# Patient Record
Sex: Female | Born: 1960 | Race: White | Hispanic: No | Marital: Single | Smoking: Former smoker
Health system: Southern US, Community
[De-identification: ages and names within clinical notes are randomized; demographics above are authoritative.]

## PROBLEM LIST (undated history)

## (undated) DIAGNOSIS — G709 Myoneural disorder, unspecified: Secondary | ICD-10-CM

## (undated) DIAGNOSIS — E785 Hyperlipidemia, unspecified: Secondary | ICD-10-CM

## (undated) DIAGNOSIS — R7303 Prediabetes: Secondary | ICD-10-CM

## (undated) DIAGNOSIS — M1990 Unspecified osteoarthritis, unspecified site: Secondary | ICD-10-CM

## (undated) DIAGNOSIS — J449 Chronic obstructive pulmonary disease, unspecified: Secondary | ICD-10-CM

## (undated) HISTORY — PX: DILATION AND CURETTAGE OF UTERUS: SHX78

## (undated) HISTORY — DX: Hyperlipidemia, unspecified: E78.5

## (undated) HISTORY — PX: WISDOM TOOTH EXTRACTION: SHX21

## (undated) HISTORY — PX: TONSILLECTOMY: SUR1361

---

## 2014-11-21 ENCOUNTER — Institutional Professional Consult (permissible substitution): Payer: 59 | Admitting: Internal Medicine

## 2014-11-26 ENCOUNTER — Ambulatory Visit (INDEPENDENT_AMBULATORY_CARE_PROVIDER_SITE_OTHER): Payer: 59 | Admitting: Internal Medicine

## 2014-11-26 ENCOUNTER — Encounter (INDEPENDENT_AMBULATORY_CARE_PROVIDER_SITE_OTHER): Payer: Self-pay

## 2014-11-26 ENCOUNTER — Encounter: Payer: Self-pay | Admitting: Internal Medicine

## 2014-11-26 VITALS — BP 122/80 | HR 93 | Ht 61.0 in | Wt 137.0 lb

## 2014-11-26 DIAGNOSIS — F17210 Nicotine dependence, cigarettes, uncomplicated: Secondary | ICD-10-CM

## 2014-11-26 DIAGNOSIS — Z720 Tobacco use: Secondary | ICD-10-CM

## 2014-11-26 DIAGNOSIS — Z87891 Personal history of nicotine dependence: Secondary | ICD-10-CM | POA: Insufficient documentation

## 2014-11-26 DIAGNOSIS — J449 Chronic obstructive pulmonary disease, unspecified: Secondary | ICD-10-CM

## 2014-11-26 MED ORDER — BUDESONIDE-FORMOTEROL FUMARATE 80-4.5 MCG/ACT IN AERO: INHALATION_SPRAY | RESPIRATORY_TRACT | Status: DC

## 2014-11-26 NOTE — Progress Notes (Signed)
Subjective:     Patient ID: Ann Richardson, female   DOB: May 07, 1961,   MRN: [id number]  HPI  [age] yowf active smoker never intense aerobic but able to do sports/ soft ball and did fine until around 2010 with doe with yardwork with assoc cough in addition has in frequency frequency episodes of bronchitis rx abx/ prednisone better then tried advair/ singulair no better so referred pulmonary clinic 11/26/2014 by  Ann RenderElizabeth Cobb NP    11/26/2014 1st [facility] office visit/ Ann Richardson   Chief Complaint  Patient presents with  . Pulmonary Consult    Referred by Ann RenderElizabeth Cobb, NP. Pt states she has been dxed with Bronchitis x 3 so far in 2016. She c/o cough and SOB. She gets SOB with walking up stairs. Her cough is non prod at this time. She is using albuterol inhaler 2-3 x per wk on average.   no real change symptoms on advair but hfa poor  Cough quite a bit worse in am but not productive p last round of prednisone and her goal is to prevent future flares   No obvious day to day or daytime variabilty or assoc   cp or chest tightness, subjective wheeze overt sinus or hb symptoms. No unusual exp hx or h/o childhood pna/ asthma or knowledge of premature birth.  Sleeping ok without nocturnal  or early am exacerbation  of respiratory  c/o's or need for noct saba. Also denies any obvious fluctuation of symptoms with weather or environmental changes or other aggravating or alleviating factors except as outlined above   Current Medications, Allergies, Complete Past Medical History, Past Surgical History, Family History, and Social History were reviewed in [facility] electronic medical record.  ROS  The following are not active complaints unless bolded sore throat, dysphagia, dental problems, itching, sneezing,  nasal congestion or excess/ purulent secretions, ear ache,   fever, chills, sweats, unintended wt loss, pleuritic or exertional cp, hemoptysis,  orthopnea pnd or leg swelling, presyncope,  palpitations, heartburn, abdominal pain, anorexia, nausea, vomiting, diarrhea  or change in bowel or urinary habits, change in stools or urine, dysuria,hematuria,  rash, arthralgias, visual complaints, headache, numbness weakness or ataxia or problems with walking or coordination,  change in mood/affect or memory.          Review of Systems     Objective:   Physical Exam amb wf nad with strong [place] accent   Wt Readings from Last 3 Encounters:  11/26/14 137 lb (62.143 kg)    Vital signs reviewed   HEENT: nl dentition, turbinates, and orophanx. Nl external ear canals without cough reflex   NECK :  without JVD/Nodes/TM/ nl carotid upstrokes bilaterally   LUNGS: no acc muscle use, minimal insp and exp rhonchi bilaterally s cough provoked   CV:  RRR  no s3 or murmur or increase in P2, no edema   ABD:  soft and nontender with nl excursion in the supine position. No bruits or organomegaly, bowel sounds nl  MS:  warm without deformities, calf tenderness, cyanosis or clubbing  SKIN: warm and dry without lesions    NEURO:  alert, approp, no deficits    I personally reviewed images and agree with radiology impression as follows:  CXR:  Nov 05 2014   wnl       Assessment:

## 2014-11-26 NOTE — Assessment & Plan Note (Signed)

## 2014-11-26 NOTE — Assessment & Plan Note (Signed)
She is probably only mild copd/CB but with chronic dry cough and doe needs pfts to sort out  In meantime symptoms have been difficult to control on rx with advair and singuair and apparently only respond to prednisone   DDX of  difficult airways management all start with A and  include Adherence, Ace Inhibitors, Acid Reflux, Active Sinus Disease, Alpha 1 Antitripsin deficiency, Anxiety masquerading as Airways dz,  ABPA,  allergy(esp in young), Aspiration (esp in elderly), Adverse effects of DPI,  Active smokers, plus two Bs  = Bronchiectasis and Beta blocker use..and one C= CHF  Adherence is always the initial "prime suspect" and is a multilayered concern that requires a "trust but verify" approach in every patient - starting with knowing how to use medications, especially inhalers, correctly, keeping up with refills and understanding the fundamental difference between maintenance and prns vs those medications only taken for a very short course and then stopped and not refilled.  The proper method of use, as well as anticipated side effects, of a metered-dose inhaler are discussed and demonstrated to the patient. Improved effectiveness after extensive coaching during this visit to a level of approximately  75% from a baseline of < 25%  Active smoking clearly the greatest issue - discussed sep  ? Allergy > not a typical pattern at all, ok to d/c singulair  ? Adverse effect of meds > need to be careful here because ICS in high doses and most advair products cause pts to cough in a dose dep fashion due to effects on upper airway   Each maintenance medication was reviewed in detail including most importantly the difference between maintenance and as needed and under what circumstances the prns are to be used.  Please see instructions for details which were reviewed in writing and the patient given a copy.

## 2014-11-26 NOTE — Patient Instructions (Addendum)
Stop advair and singulair   Plan A = automatic = symbicort 80 Take 2 puffs first thing in am and then another 2 puffs about 12 hours later.   Plan B = backup - Only use your albuterol as a rescue medication to be used if you can't catch your breath by resting or doing a relaxed purse lip breathing pattern.  - The less you use it, the better it will work when you need it. - Ok to use up to 2 puffs  every 4 hours if you must but call for immediate appointment if use goes up over your usual need - Don't leave home without it !!  (think of it like the spare tire for your car)   The key is to stop smoking completely before smoking completely stops you!   Please schedule a follow up office visit in 6 weeks, call sooner if needed with pfts on return

## 2015-01-07 ENCOUNTER — Other Ambulatory Visit: Payer: Self-pay | Admitting: Internal Medicine

## 2015-01-07 DIAGNOSIS — R0600 Dyspnea, unspecified: Secondary | ICD-10-CM

## 2015-01-08 ENCOUNTER — Ambulatory Visit (INDEPENDENT_AMBULATORY_CARE_PROVIDER_SITE_OTHER): Payer: 59 | Admitting: Internal Medicine

## 2015-01-08 ENCOUNTER — Encounter: Payer: Self-pay | Admitting: Internal Medicine

## 2015-01-08 ENCOUNTER — Other Ambulatory Visit: Payer: 59

## 2015-01-08 VITALS — BP 118/80 | HR 79 | Ht 61.0 in | Wt 140.0 lb

## 2015-01-08 DIAGNOSIS — R0600 Dyspnea, unspecified: Secondary | ICD-10-CM

## 2015-01-08 DIAGNOSIS — F17210 Nicotine dependence, cigarettes, uncomplicated: Secondary | ICD-10-CM

## 2015-01-08 DIAGNOSIS — J449 Chronic obstructive pulmonary disease, unspecified: Secondary | ICD-10-CM

## 2015-01-08 DIAGNOSIS — Z720 Tobacco use: Secondary | ICD-10-CM | POA: Diagnosis not present

## 2015-01-08 LAB — PULMONARY FUNCTION TEST
DL/VA % pred: 73 %
DL/VA: 3.23 ml/min/mmHg/L
DLCO unc % pred: 57 %
DLCO unc: 11.61 ml/min/mmHg
FEF 25-75 Post: 0.72 L/sec
FEF 25-75 Pre: 0.5 L/sec
FEF2575-%Change-Post: 42 %
FEF2575-%PRED-PRE: 21 %
FEF2575-%Pred-Post: 30 %
FEV1-%Change-Post: 9 %
FEV1-%PRED-POST: 54 %
FEV1-%Pred-Pre: 50 %
FEV1-Post: 1.31 L
FEV1-Pre: 1.2 L
FEV1FVC-%Change-Post: -3 %
FEV1FVC-%PRED-PRE: 73 %
FEV6-%Change-Post: 11 %
FEV6-%PRED-POST: 75 %
FEV6-%PRED-PRE: 67 %
FEV6-POST: 2.25 L
FEV6-Pre: 2.01 L
FEV6FVC-%Change-Post: 0 %
FEV6FVC-%Pred-Post: 99 %
FEV6FVC-%Pred-Pre: 100 %
FVC-%CHANGE-POST: 12 %
FVC-%Pred-Post: 75 %
FVC-%Pred-Pre: 67 %
FVC-PRE: 2.06 L
FVC-Post: 2.33 L
POST FEV1/FVC RATIO: 56 %
POST FEV6/FVC RATIO: 97 %
Pre FEV1/FVC ratio: 58 %
Pre FEV6/FVC Ratio: 97 %
RV % pred: 132 %
RV: 2.32 L
TLC % pred: 101 %
TLC: 4.69 L

## 2015-01-08 MED ORDER — BUDESONIDE-FORMOTEROL FUMARATE 160-4.5 MCG/ACT IN AERO: INHALATION_SPRAY | RESPIRATORY_TRACT | Status: DC

## 2015-01-08 NOTE — Progress Notes (Signed)
Subjective:     Patient ID: Ann Richardson, female   DOB: Jan 04, 1961,   MRN: [id number]  HPI  43 yowf active smoker never intense aerobic but able to do sports/ soft ball and did fine until around 2010 with doe with yardwork with assoc cough in addition has in frequency frequency episodes of bronchitis rx abx/ prednisone better then tried advair/ singulair no better so referred pulmonary clinic 11/26/2014 by  Ann Render NP    11/26/2014 1st [facility] office visit/ Ann Richardson   Chief Complaint  Patient presents with  . Pulmonary Consult    Referred by Ann Render, NP. Pt states she has been dxed with Bronchitis x 3 so far in 2016. She c/o cough and SOB. She gets SOB with walking up stairs. Her cough is non prod at this time. She is using albuterol inhaler 2-3 x per wk on average.   no real change symptoms on advair but hfa poor  Cough quite a bit worse in am but not productive p last round of prednisone and her goal is to prevent future flares  rec Stop advair and singulair  Plan A = automatic = symbicort 80 Take 2 puffs first thing in am and then another 2 puffs about 12 hours later.  Plan B = backup - Only use your albuterol as a rescue medication   01/08/2015 f/u ov/Ann Richardson re: copd  GOLD II/ confused with meds/ names maint vs prns  - thinks she's still on advair  Chief Complaint  Patient presents with  . office visit    pft done, inhalers are helping, breathing not bad asit was going up stairs & 1st thing in the AM   poor mdi but doing better / cough some better but still some  Min white mucus production   No obvious day to day or daytime variabilty or assoc excess or purulent sputum cp or chest tightness, subjective wheeze overt sinus or hb symptoms. No unusual exp hx or h/o childhood pna/ asthma or knowledge of premature birth.  Sleeping ok without nocturnal  or early am exacerbation  of respiratory  c/o's or need for noct saba. Also denies any obvious fluctuation of symptoms  with weather or environmental changes or other aggravating or alleviating factors except as outlined above   Current Medications, Allergies, Complete Past Medical History, Past Surgical History, Family History, and Social History were reviewed in [facility] record.  ROS  The following are not active complaints unless bolded sore throat, dysphagia, dental problems, itching, sneezing,  nasal congestion or excess/ purulent secretions, ear ache,   fever, chills, sweats, unintended wt loss, pleuritic or exertional cp, hemoptysis,  orthopnea pnd or leg swelling, presyncope, palpitations, heartburn, abdominal pain, anorexia, nausea, vomiting, diarrhea  or change in bowel or urinary habits, change in stools or urine, dysuria,hematuria,  rash, arthralgias, visual complaints, headache, numbness weakness or ataxia or problems with walking or coordination,  change in mood/affect or memory.             Objective:   Physical Exam  amb wf nad with strong [place] accent  Wt Readings from Last 3 Encounters:  01/08/15 140 lb (63.504 kg)  11/26/14 137 lb (62.143 kg)    Vital signs reviewed    HEENT: nl dentition, turbinates, and orophanx. Nl external ear canals without cough reflex   NECK :  without JVD/Nodes/TM/ nl carotid upstrokes bilaterally   LUNGS: no acc muscle use, minimal insp and exp rhonchi bilaterally s cough provoked  CV:  RRR  no s3 or murmur or increase in P2, no edema   ABD:  soft and nontender with nl excursion in the supine position. No bruits or organomegaly, bowel sounds nl  MS:  warm without deformities, calf tenderness, cyanosis or clubbing  SKIN: warm and dry without lesions    NEURO:  alert, approp, no deficits    I personally reviewed images and agree with radiology impression as follows:  CXR:  Nov 05 2014   wnl        Assessment:        Outpatient Encounter Prescriptions as of 01/08/2015  Medication Sig  . albuterol (PROVENTIL  HFA;VENTOLIN HFA) 108 (90 BASE) MCG/ACT inhaler Inhale 2 puffs into the lungs every 6 (six) hours as needed for wheezing or shortness of breath.  Marland Kitchen. amoxicillin (AMOXIL) 500 MG capsule   . atorvastatin (LIPITOR) 10 MG tablet Take 10 mg by mouth daily.  Marland Kitchen. HYDROcodone-acetaminophen (NORCO/VICODIN) 5-325 MG per tablet   . Testosterone Propionate (FIRST-TESTOSTERONE MC) 2 % CREA Place onto the skin 2 (two) times a week.  . [DISCONTINUED] ADVAIR HFA [id number] MCG/ACT inhaler   .    . budesonide-formoterol (SYMBICORT) 160-4.5 MCG/ACT inhaler Take 2 puffs first thing in am and then another 2 puffs about 12 hours later.  .     . [DISCONTINUED] estradiol-norethindrone (COMBIPATCH) 0.05-0.25 MG/DAY Place 1 patch onto the skin 2 (two) times a week.   No facility-administered encounter medications on file as of 01/08/2015.

## 2015-01-08 NOTE — Progress Notes (Signed)
PFT done today. 

## 2015-01-08 NOTE — Patient Instructions (Signed)
You have GOLD II severity-  moderate and will continue to progress as long as you smoke - the key is to stop smoking now   Continue symbicort  160  Take 2 puffs first thing in am and then another 2 puffs about 12 hours later.   Work on inhaler technique:  relax and gently blow all the way out then take a nice smooth deep breath back in, triggering the inhaler at same time you start breathing in.  Hold for up to 5 seconds if you can.  Rinse and gargle with water when done.  Please remember to go to the lab department downstairs for your tests - we will call you with the results when they are available.  Please schedule a follow up visit in 3 months but call sooner if needed

## 2015-01-11 ENCOUNTER — Encounter: Payer: Self-pay | Admitting: Internal Medicine

## 2015-01-11 NOTE — Assessment & Plan Note (Signed)
-   11/26/2014 p extensive coaching HFA effectiveness =    75% (Ti too short)  - 01/08/2015 p extensive coaching HFA effectiveness =    75%  - 01/08/2015 pfts  FEV1  1.31 (54%) s am meds and ratio 56% p 9% improvement form saba with dlco 57 to 73% p correct for alv vol > rec symb 160 2bid  - alpha one  01/08/2015 >>>    Doing better but sill smoking and easily confused between maint and prns  I had an extended discussion with the patient reviewing all relevant studies completed to date and  lasting 15 to 20 minutes of a 25 minute visit    Each maintenance medication was reviewed in detail including most importantly the difference between maintenance and prns and under what circumstances the prns are to be triggered using an action plan format that is not reflected in the computer generated alphabetically organized AVS.    Please see instructions for details which were reviewed in writing and the patient given a copy highlighting the part that I personally wrote and discussed at today's ov.

## 2015-01-11 NOTE — Assessment & Plan Note (Signed)
>   3 min discussion I reviewed the Fletcher curve with the patient that basically indicates  if you quit smoking when your best day FEV1 is still well preserved (as is relatively  the case here)  it is highly unlikely you will progress to severe disease and informed the patient there was no medication on the market that has proven to alter the curve/ its downward trajectory  or the likelihood of progression of their disease.  Therefore stopping smoking and maintaining abstinence is the most important aspect of care, not choice of inhalers or for that matter, doctors.

## 2015-01-12 LAB — ALPHA-1-ANTITRYPSIN: A-1 Antitrypsin, Ser: 155 mg/dL (ref 83–199)

## 2015-01-15 LAB — ALPHA-1 ANTITRYPSIN PHENOTYPE: A1 ANTITRYPSIN: 156 mg/dL (ref 83–199)

## 2015-01-16 ENCOUNTER — Telehealth: Payer: Self-pay | Admitting: Internal Medicine

## 2015-01-16 NOTE — Progress Notes (Signed)
Quick Note:  LMTCB ______ 

## 2015-01-16 NOTE — Telephone Encounter (Signed)
Per lab result note: Result Note     Call patient : Study is unremarkable, no change in recs   I spoke with patient about results and she verbalized understanding and had no questions

## 2015-04-10 ENCOUNTER — Encounter: Payer: Self-pay | Admitting: Internal Medicine

## 2015-04-10 ENCOUNTER — Ambulatory Visit (INDEPENDENT_AMBULATORY_CARE_PROVIDER_SITE_OTHER): Payer: 59 | Admitting: Internal Medicine

## 2015-04-10 VITALS — BP 120/90 | HR 89 | Ht 61.0 in | Wt 141.4 lb

## 2015-04-10 DIAGNOSIS — F17210 Nicotine dependence, cigarettes, uncomplicated: Secondary | ICD-10-CM | POA: Diagnosis not present

## 2015-04-10 DIAGNOSIS — J449 Chronic obstructive pulmonary disease, unspecified: Secondary | ICD-10-CM | POA: Diagnosis not present

## 2015-04-10 MED ORDER — VARENICLINE TARTRATE 0.5 MG X 11 & 1 MG X 42 PO MISC: ORAL | Status: DC

## 2015-04-10 NOTE — Patient Instructions (Addendum)
chantix starter pack - we can call you in 2 more months if you haven't established with primary by then   If you are satisfied with your treatment plan,  let your doctor know and he/she can either refill your medications or you can return here when your prescription runs out.     If in any way you are not 100% satisfied,  please tell us.  If 100% better, tell your friends!  Pulmonary follow up is as needed

## 2015-04-10 NOTE — Progress Notes (Signed)
Subjective:     Patient ID: Ann Richardson, female   DOB: May 09, 1961,   MRN: [id number]    Brief patient profile:  [age] yowf active smoker never intense aerobic but able to do sports/ soft ball and did fine until around 2010 with doe with yardwork with assoc cough in addition has in frequency frequency episodes of bronchitis rx abx/ prednisone better then tried advair/ singulair no better so referred pulmonary clinic 11/26/2014 by  Cooper RenderElizabeth Cobb NP with dx GOLD II copd/ MM phenotype  01/08/15    History of Present Illness  11/26/2014 1st [facility] office visit/ Ann Richardson   Chief Complaint  Patient presents with  . Pulmonary Consult    Referred by Cooper RenderElizabeth Cobb, NP. Pt states she has been dxed with Bronchitis x 3 so far in 2016. She c/o cough and SOB. She gets SOB with walking up stairs. Her cough is non prod at this time. She is using albuterol inhaler 2-3 x per wk on average.   no real change symptoms on advair but hfa poor  Cough quite a bit worse in am but not productive p last round of prednisone and her goal is to prevent future flares  rec Stop advair and singulair  Plan A = automatic = symbicort 80 Take 2 puffs first thing in am and then another 2 puffs about 12 hours later.  Plan B = backup - Only use your albuterol as a rescue medication   01/08/2015 f/u ov/Ann Richardson re: copd  GOLD II/ confused with meds/ names maint vs prns  - thinks she's still on advair  Chief Complaint  Patient presents with  . office visit    pft done, inhalers are helping, breathing not bad asit was going up stairs & 1st thing in the AM   poor mdi but doing better / cough some better but still some  Min white mucus production  rec You have GOLD II severity-  moderate and will continue to progress as long as you smoke - the key is to stop smoking now  Continue symbicort  160  Take 2 puffs first thing in am and then another 2 puffs about 12 hours later.  Work on inhaler technique:   Please schedule a follow up  visit in 3 months but call sooner if needed     04/10/2015  f/u ov/Ann Richardson re: COPD II on symbicort 160 2bid  Chief Complaint  Patient presents with  . Follow-up    Pt states doing well. She has not needed albuterol. No new co's today.  Not limited by breathing from desired activities   / occ am cough but no excess / purulent sputum   No obvious day to day or daytime variabilty or assoc  cp or chest tightness, subjective wheeze overt sinus or hb symptoms. No unusual exp hx or h/o childhood pna/ asthma or knowledge of premature birth.  Sleeping ok without nocturnal  or early am exacerbation  of respiratory  c/o's or need for noct saba. Also denies any obvious fluctuation of symptoms with weather or environmental changes or other aggravating or alleviating factors except as outlined above   Current Medications, Allergies, Complete Past Medical History, Past Surgical History, Family History, and Social History were reviewed in [facility] electronic medical record.  ROS  The following are not active complaints unless bolded sore throat, dysphagia, dental problems, itching, sneezing,  nasal congestion or excess/ purulent secretions, ear ache,   fever, chills, sweats, unintended wt loss, pleuritic or exertional  cp, hemoptysis,  orthopnea pnd or leg swelling, presyncope, palpitations, heartburn, abdominal pain, anorexia, nausea, vomiting, diarrhea  or change in bowel or urinary habits, change in stools or urine, dysuria,hematuria,  rash, arthralgias, visual complaints, headache, numbness weakness or ataxia or problems with walking or coordination,  change in mood/affect or memory.             Objective:   Physical Exam  amb wf nad with strong b[place] accent  Wt Readings from Last 3 Encounters:  04/10/15 141 lb 6.4 oz (64.139 kg)  01/08/15 140 lb (63.504 kg)  11/26/14 137 lb (62.143 kg)    Vital signs reviewed    HEENT: nl dentition, turbinates, and orophanx. Nl external ear canals  without cough reflex   NECK :  without JVD/Nodes/TM/ nl carotid upstrokes bilaterally   LUNGS: no acc muscle use,     Min exp rhonchi bilaterally, no exp cough    CV:  RRR  no s3 or murmur or increase in P2, no edema   ABD:  soft and nontender with nl excursion in the supine position. No bruits or organomegaly, bowel sounds nl  MS:  warm without deformities, calf tenderness, cyanosis or clubbing  SKIN: warm and dry without lesions    NEURO:  alert, approp, no deficits    I personally reviewed images and agree with radiology impression as follows:  CXR:  Nov 05 2014   wnl        Assessment:        Outpatient Encounter Prescriptions as of 04/10/2015  Medication Sig  . albuterol (PROVENTIL HFA;VENTOLIN HFA) 108 (90 BASE) MCG/ACT inhaler Inhale 2 puffs into the lungs every 6 (six) hours as needed for wheezing or shortness of breath.  Marland Kitchen atorvastatin (LIPITOR) 10 MG tablet Take 10 mg by mouth daily.  . budesonide-formoterol (SYMBICORT) 160-4.5 MCG/ACT inhaler Take 2 puffs first thing in am and then another 2 puffs about 12 hours later.  . Testosterone Propionate (FIRST-TESTOSTERONE MC) 2 % CREA Place onto the skin 2 (two) times a week.  . varenicline (CHANTIX PAK) 0.5 MG X 11 & 1 MG X 42 tablet Take one 0.5 mg tablet by mouth once daily for 3 days, then increase to one 0.5 mg tablet twice daily for 4 days, then increase to one 1 mg tablet twice daily.  . [DISCONTINUED] amoxicillin (AMOXIL) 500 MG capsule   . [DISCONTINUED] HYDROcodone-acetaminophen (NORCO/VICODIN) 5-325 MG per tablet    No facility-administered encounter medications on file as of 04/10/2015.

## 2015-04-11 NOTE — Assessment & Plan Note (Signed)
-   11/26/2014 p extensive coaching HFA effectiveness =    75% (Ti too short)  - 01/08/2015 p extensive coaching HFA effectiveness =    75%  - 01/08/2015 pfts  FEV1  1.31 (54%) s am meds and ratio 56% p 9% improvement form saba with dlco 57 to 73% p correct for alv vol > rec symb 160 2bid  - alpha one  01/08/2015 > MM  Despite severity of copd, she's doing well just on symbicort and min need for saba/ no tendency to aecopd despite still smoking.   I had an extended summary final discussion with the patient reviewing all relevant studies completed to date and  lasting 15 to 20 minutes of a 25 minute visit    Each maintenance medication was reviewed in detail including most importantly the difference between maintenance and prns and under what circumstances the prns are to be triggered using an action plan format that is not reflected in the computer generated alphabetically organized AVS.    Please see instructions for details which were reviewed in writing and the patient given a copy highlighting the part that I personally wrote and discussed at today's ov.

## 2015-04-11 NOTE — Assessment & Plan Note (Signed)
>>   3 m  Discussed in detail all the  indications, usual  risks and alternatives  relative to the benefits with patient who agrees to proceed with chantix trial and f/u with primary care

## 2016-03-03 ENCOUNTER — Other Ambulatory Visit: Payer: Self-pay | Admitting: Internal Medicine

## 2016-05-03 ENCOUNTER — Other Ambulatory Visit: Payer: Self-pay | Admitting: Internal Medicine

## 2016-05-16 ENCOUNTER — Telehealth: Payer: Self-pay | Admitting: Internal Medicine

## 2016-05-16 NOTE — Telephone Encounter (Signed)
Called and informed pt. That we will call her once the paper work is finished and faxed. She had no further questions at this time.   Message forwarded to [employer].

## 2016-05-18 NOTE — Telephone Encounter (Signed)
Forms faxed  LMTCB to appt to be scheduled

## 2016-05-18 NOTE — Telephone Encounter (Signed)
Not seen > 1 year, will need ov with all active meds in hand preop - ok to add on

## 2016-05-18 NOTE — Telephone Encounter (Signed)
I have placed form for surgery clearance in Dr Thurston HoleWert's lookat

## 2016-05-23 ENCOUNTER — Ambulatory Visit (HOSPITAL_COMMUNITY)
Admission: RE | Admit: 2016-05-23 | Discharge: 2016-05-23 | Disposition: A | Discharge status: Home / Self Care | Payer: Managed Care, Other (non HMO) | Source: Ambulatory Visit | Attending: Orthopedic Surgery | Admitting: Orthopedic Surgery

## 2016-05-23 ENCOUNTER — Other Ambulatory Visit (HOSPITAL_COMMUNITY): Payer: Self-pay | Admitting: Orthopedic Surgery

## 2016-05-23 DIAGNOSIS — M79605 Pain in left leg: Secondary | ICD-10-CM

## 2016-05-23 DIAGNOSIS — M7989 Other specified soft tissue disorders: Secondary | ICD-10-CM | POA: Insufficient documentation

## 2016-05-23 NOTE — Telephone Encounter (Signed)
Called and spoke to pt. Appt made with MW on 06/06/16. Pt verbalized understanding and denied any further questions or concerns at this time.

## 2016-05-23 NOTE — Progress Notes (Addendum)
*  PRELIMINARY RESULTS* Vascular Ultrasound Left lower extremity venous duplex has been completed.  Preliminary findings: Left lower extremity negative for deep vein thrombosis and bakers cyst.  Attempted to call preliminary results to Dr. Shon BatonBrooks at [phone] without success. Preliminary results will be routed to Dr. Shon BatonBrooks and patient will be discharged.  Ann FischerCharlotte C Kaniya Richardson 05/23/2016, 11:41 AM

## 2016-05-26 ENCOUNTER — Ambulatory Visit: Payer: Self-pay | Admitting: Physician Assistant

## 2016-06-06 ENCOUNTER — Ambulatory Visit (INDEPENDENT_AMBULATORY_CARE_PROVIDER_SITE_OTHER)
Admission: RE | Admit: 2016-06-06 | Discharge: 2016-06-06 | Disposition: A | Discharge status: Home / Self Care | Payer: Managed Care, Other (non HMO) | Source: Ambulatory Visit | Attending: Internal Medicine | Admitting: Internal Medicine

## 2016-06-06 ENCOUNTER — Encounter: Payer: Self-pay | Admitting: Internal Medicine

## 2016-06-06 ENCOUNTER — Ambulatory Visit (INDEPENDENT_AMBULATORY_CARE_PROVIDER_SITE_OTHER): Payer: Managed Care, Other (non HMO) | Admitting: Internal Medicine

## 2016-06-06 VITALS — BP 134/82 | HR 98 | Ht 60.0 in | Wt 130.0 lb

## 2016-06-06 DIAGNOSIS — J449 Chronic obstructive pulmonary disease, unspecified: Secondary | ICD-10-CM | POA: Diagnosis not present

## 2016-06-06 DIAGNOSIS — F17210 Nicotine dependence, cigarettes, uncomplicated: Secondary | ICD-10-CM | POA: Diagnosis not present

## 2016-06-06 NOTE — Assessment & Plan Note (Addendum)
-   01/08/2015 pfts  FEV1  1.31 (54%) s am meds and ratio 56% p 9% improvement form saba with dlco 57 to 73% p correct for alv vol > rec symb 160 2bid  - alpha one  01/08/2015 > MM  - 06/06/2016  After extensive coaching HFA effectiveness =    75%   Despite active smoking (see separate a/p) and suboptimal hfa she is well compensated and clearly more limited by back/leg than lungs.  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 so is at risk of post op complications included but not limited to atx/ hcap/resp failure but they are not prohibitive nor are there set limits to FEV1 requirement in non-thoracic surgery so she is cleared for surgery with the caveat she try hard to quit smoking in meantime  Total time devoted to counseling  > 50 % of 40  preop consultation/office visit:  review case with pt/ discussion of options/alternatives/ personally creating written customized instructions  in presence of pt  then going over those specific  Instructions directly with the pt including how to use all of the meds but in particular covering each new medication in detail and the difference between the maintenance/automatic meds and the prns using an action plan format for the latter.  Please see AVS from this visit for a full list of these instructions.

## 2016-06-06 NOTE — Patient Instructions (Addendum)
Strongly rec you start the chantix and stop the smoking  You are cleared for surgery as long as no flare in meantime -   Please remember to go to the x-ray department downstairs for your tests - we will call you with the results when they are available.  Work on Chemical engineerperfecting inhaler technique:  relax and gently blow all the way out then take a nice smooth deep breath back in, triggering the inhaler at same time you start breathing in.  Hold for up to 5 seconds if you can. Blow out thru nose. Rinse and gargle with water when done    If you are satisfied with your treatment plan,  let your doctor know and he/she can either refill your medications or you can return here when your prescription runs out.     If in any way you are not 100% satisfied,  please tell us.  If 100% better, tell your friends!  Pulmonary follow up is as needed

## 2016-06-06 NOTE — Assessment & Plan Note (Signed)
.>   3 min  Discussed additional risks she is taking in the short run by continuing to smoke between now and surgery as ideally need 2 full weeks off cigs and if has copd flare in meantime should not have surgery at all - the likelihood of a flare would clearly be lower off cigs  rec go ahead and start chantix now, at the very least it may reduce the smoke exp preop

## 2016-06-06 NOTE — Progress Notes (Signed)
Spoke with pt and notified of results per Dr. Wert. Pt verbalized understanding and denied any questions. 

## 2016-06-06 NOTE — Progress Notes (Signed)
Subjective:     Patient ID: Ann Richardson, female   DOB: Feb 04, 1961,   MRN: [id number]    Brief patient profile:  [age] yowf active smoker never intense aerobic but able to do sports/ soft ball and did fine until around 2010 with doe with yardwork with assoc cough in addition has in frequency frequency episodes of bronchitis rx abx/ prednisone better then tried advair/ singulair no better so referred pulmonary clinic 11/26/2014 by  Ann Richardson with dx GOLD II copd/ MM phenotype  01/08/15    History of Present Illness  11/26/2014 [facility]st [facility] office visit/ Ann Richardson   Chief Complaint  Patient presents with  . Pulmonary Consult    Referred by Ann RenderElizabeth Cobb, Richardson. Pt states she has been dxed with Bronchitis x 3 so far in 2016. She c/o cough and SOB. She gets SOB with walking up stairs. Her cough is non prod at this time. She is using albuterol inhaler 2-3 x per wk on average.   no real change symptoms on advair but hfa poor  Cough quite a bit worse in am but not productive p last round of prednisone and her goal is to prevent future flares  rec Stop advair and singulair  Plan A = automatic = symbicort 80 Take 2 puffs first thing in am and then another 2 puffs about 12 hours later.  Plan B = backup - Only use your albuterol as a rescue medication   01/08/2015 f/u ov/Ann Richardson re: copd  GOLD II/ confused with meds/ names maint vs prns  - thinks she's still on advair  Chief Complaint  Patient presents with  . office visit    pft done, inhalers are helping, breathing not bad asit was going up stairs & 1st thing in the AM   poor mdi but doing better / cough some better but still some  Min white mucus production  rec You have GOLD II severity-  moderate and will continue to progress as long as you smoke - the key is to stop smoking now  Continue symbicort  160  Take 2 puffs first thing in am and then another 2 puffs about 12 hours later.  Work on inhaler technique:   Please schedule a follow up  visit in 3 months but call sooner if needed     04/10/2015  f/u ov/Ann Richardson re: COPD II on symbicort 160 2bid  Chief Complaint  Patient presents with  . Follow-up    Pt states doing well. She has not needed albuterol. No new co's today.  Not limited by breathing from desired activities   / occ am cough but no excess / purulent sputum rec chantix starter pack - we can call you in 2 more months if you haven't established with primary by then    06/06/2016  Extended f/u ov/Ann Richardson consult re: Preo op pulmonary eval for L4 surguery: COPD II / symbicort 2 bid and no need for saba Chief Complaint  Patient presents with  . Follow-up    Needing pulmonary clearance for nerve decompression surgery. She states her breathing is doing well and can not recall the last time that she needed albuterol.   sleeps ok resp wise / am cough occ one half hour > thin mucus/ not presently  Limited by L leg and foot pain/weakness not by breathing   No obvious day to day or daytime variabilty or assoc  cp or chest tightness, subjective wheeze overt sinus or hb symptoms. No unusual exp hx  or h/o childhood pna/ asthma or knowledge of premature birth.  Sleeping ok without nocturnal  or early am exacerbation  of respiratory  c/o's or need for noct saba. Also denies any obvious fluctuation of symptoms with weather or environmental changes or other aggravating or alleviating factors except as outlined above   Current Medications, Allergies, Complete Past Medical History, Past Surgical History, Family History, and Social History were reviewed in [facility] electronic [facility].  ROS  The following are not active complaints unless bolded sore throat, dysphagia, dental problems, itching, sneezing,  nasal congestion or excess/ purulent secretions, ear ache,   fever, chills, sweats, unintended wt loss, pleuritic or exertional cp, hemoptysis,  orthopnea pnd or leg swelling, presyncope, palpitations, heartburn, abdominal  pain, anorexia, nausea, vomiting, diarrhea  or change in bowel or urinary habits, change in stools or urine, dysuria,hematuria,  rash, arthralgias, visual complaints, headache, numbness weakness or ataxia or problems with walking or coordination,  change in mood/affect or memory             Objective:   Physical Exam  amb wf nad with strong b[place] accent/ min rattling cough on voluntary maneuver   06/06/2016    130  04/10/15 141 lb 6.4 oz (64.139 kg)  01/08/15 140 lb (63.504 kg)  11/26/14 137 lb (62.143 kg)    Vital signs reviewed - Note on arrival 02 sats  100% on RA     HEENT: nl dentition, turbinates, and orophanx. Nl external ear canals without cough reflex   NECK :  without JVD/Nodes/TM/ nl carotid upstrokes bilaterally   LUNGS: no acc muscle use,     Distant bs bilaterally s true wheeze   CV:  RRR  no s3 or murmur or increase in P2, no edema   ABD:  soft and nontender with end insp hoover's in the supine position. No bruits or organomegaly, bowel sounds nl  MS:  warm without deformities, calf tenderness, cyanosis or clubbing  SKIN: warm and dry without lesions    NEURO:  alert, approp, no deficits   CXR PA and Lateral:   06/06/2016 :    I personally reviewed images and agree with radiology impression as follows:    Scattered calcified granulomas.  No edema or consolidation         Assessment:

## 2016-06-09 ENCOUNTER — Encounter (HOSPITAL_COMMUNITY)
Admission: RE | Admit: 2016-06-09 | Discharge: 2016-06-09 | Disposition: A | Discharge status: Home / Self Care | Payer: Managed Care, Other (non HMO) | Source: Ambulatory Visit | Attending: Orthopedic Surgery | Admitting: Orthopedic Surgery

## 2016-06-09 ENCOUNTER — Encounter (HOSPITAL_COMMUNITY): Payer: Self-pay

## 2016-06-09 DIAGNOSIS — M5430 Sciatica, unspecified side: Secondary | ICD-10-CM | POA: Insufficient documentation

## 2016-06-09 DIAGNOSIS — J449 Chronic obstructive pulmonary disease, unspecified: Secondary | ICD-10-CM | POA: Diagnosis not present

## 2016-06-09 DIAGNOSIS — M1990 Unspecified osteoarthritis, unspecified site: Secondary | ICD-10-CM | POA: Insufficient documentation

## 2016-06-09 DIAGNOSIS — E785 Hyperlipidemia, unspecified: Secondary | ICD-10-CM | POA: Diagnosis not present

## 2016-06-09 DIAGNOSIS — F17210 Nicotine dependence, cigarettes, uncomplicated: Secondary | ICD-10-CM | POA: Diagnosis not present

## 2016-06-09 DIAGNOSIS — M47899 Other spondylosis, site unspecified: Secondary | ICD-10-CM | POA: Insufficient documentation

## 2016-06-09 DIAGNOSIS — R7303 Prediabetes: Secondary | ICD-10-CM | POA: Diagnosis not present

## 2016-06-09 DIAGNOSIS — Z01812 Encounter for preprocedural laboratory examination: Secondary | ICD-10-CM | POA: Insufficient documentation

## 2016-06-09 HISTORY — DX: Myoneural disorder, unspecified: G70.9

## 2016-06-09 HISTORY — DX: Chronic obstructive pulmonary disease, unspecified: J44.9

## 2016-06-09 HISTORY — DX: Prediabetes: R73.03

## 2016-06-09 HISTORY — DX: Unspecified osteoarthritis, unspecified site: M19.90

## 2016-06-09 LAB — CBC
HEMATOCRIT: 44.5 % (ref 36.0–46.0)
HEMOGLOBIN: 15.3 g/dL — AB (ref 12.0–15.0)
MCH: 32.4 pg (ref 26.0–34.0)
MCHC: 34.4 g/dL (ref 30.0–36.0)
MCV: 94.3 fL (ref 78.0–100.0)
Platelets: 253 10*3/uL (ref 150–400)
RBC: 4.72 MIL/uL (ref 3.87–5.11)
RDW: 11.7 % (ref 11.5–15.5)
WBC: 11.1 10*3/uL — ABNORMAL HIGH (ref 4.0–10.5)

## 2016-06-09 LAB — BASIC METABOLIC PANEL
ANION GAP: 8 (ref 5–15)
BUN: 12 mg/dL (ref 6–20)
CALCIUM: 10 mg/dL (ref 8.9–10.3)
CO2: 28 mmol/L (ref 22–32)
Chloride: 103 mmol/L (ref 101–111)
Creatinine, Ser: 0.68 mg/dL (ref 0.44–1.00)
GLUCOSE: 97 mg/dL (ref 65–99)
POTASSIUM: 4.2 mmol/L (ref 3.5–5.1)
Sodium: 139 mmol/L (ref 135–145)

## 2016-06-09 LAB — SURGICAL PCR SCREEN
MRSA, PCR: NEGATIVE
STAPHYLOCOCCUS AUREUS: NEGATIVE

## 2016-06-09 NOTE — Pre-Procedure Instructions (Signed)
    Ann Richardson  06/09/2016      [facility] [address] [id number] [place] [address] Phone: [phone] Fax: [phone]    Your procedure is scheduled on Wednesday, June 15, 2016  [facility] Admitting at 8:30 A.M.  Call this number if you have problems the morning of surgery:  [phone]  NO SMOKING FOR 24HR. PRIOR TO SURGERY  Remember:  Do not eat food or drink liquids after midnight Tuesday, June 14, 2016  Take these medicines the morning of surgery with A SIP OF WATER : gabapentin (NEURONTIN), SYMBICORT  Inhaler, if needed: Oxycodone for pain, albuterol (PROVENTIL HFA;VENTOLIN HFA)  Inhaler for wheezing and shortness of breath ( bring inhaler in with you on day of surgery Stop taking Aspirin,vitamins, fish oil and herbal medications. Do not take any NSAIDs ie: Ibuprofen, Advil, Naproxen   ( Anaprox) or any medication containing Aspirin; stop now.  Do not wear jewelry, make-up or nail polish.  Do not wear lotions, powders, or perfumes, or deoderant.  Do not shave 48 hours prior to surgery.    Do not bring valuables to the hospital.  [facility] is not responsible for any belongings or valuables.  Contacts, dentures or bridgework may not be worn into surgery.  Leave your suitcase in the car.  After surgery it may be brought to your room.  For patients admitted to the hospital, discharge time will be determined by your treatment team. Patients discharged the day of surgery will not be allowed to drive home.  Special instructions: Shower the night before surgery and the morning of surgery with CHG. Please read over the following fact sheets that you were given. Pain Booklet, Coughing and Deep Breathing, MRSA Information and Surgical Site Infection Prevention

## 2016-06-09 NOTE — Progress Notes (Signed)
Pt. Followed by [facility]. Pt. Denies ever having any advanced cardiac study. Pt. Reviewed & cleared for surg. By Dr. Sherene SiresWert. Pt. Reports that she has been told that she has stage 2 COPD.

## 2016-06-10 LAB — HEMOGLOBIN A1C
HEMOGLOBIN A1C: 5.4 % (ref 4.8–5.6)
Mean Plasma Glucose: 108 mg/dL

## 2016-06-10 NOTE — Progress Notes (Signed)
Anesthesia Chart Review:  Pt is a 55 year old female scheduled for L4-5, L5-S1 decompression on 06/15/2016 with Venita Lickahari Brooks, MD.   - PCP is Dorena DewJennifer Coulliard, PA (notes in care everywhere) - Pulmonologist is Sandrea HughsMichael Wert, MD, who cleared pt for surgery at last office visit 06/06/16  PMH includes:  Pre-diabetes, COPD, hyperlipidemia.  Current smoker. BMI 25  Medications include: albuterol, lipitor, symbicort  Preoperative labs reviewed.  HgbA1c 5.4, glucose 97  CXR 06/06/16: Scattered calcified granulomas.  No edema or consolidation.  If no changes, I anticipate pt can proceed with surgery as scheduled.   Rica Mastngela Hamza Empson, FNP-BC [facility]/Anesthesiology Phone: [phone] 06/10/2016 2:47 PM

## 2016-06-15 ENCOUNTER — Observation Stay (HOSPITAL_COMMUNITY)
Admission: RE | Admit: 2016-06-15 | Discharge: 2016-06-16 | Disposition: A | Discharge status: Home / Self Care | Payer: Managed Care, Other (non HMO) | Source: Ambulatory Visit | Attending: Orthopedic Surgery | Admitting: Orthopedic Surgery

## 2016-06-15 ENCOUNTER — Encounter (HOSPITAL_COMMUNITY)
Admission: RE | Disposition: A | Discharge status: Home / Self Care | Payer: Self-pay | Source: Ambulatory Visit | Attending: Orthopedic Surgery

## 2016-06-15 ENCOUNTER — Ambulatory Visit (HOSPITAL_COMMUNITY): Payer: Managed Care, Other (non HMO)

## 2016-06-15 ENCOUNTER — Ambulatory Visit (HOSPITAL_COMMUNITY): Payer: Managed Care, Other (non HMO) | Admitting: Anesthesiology

## 2016-06-15 ENCOUNTER — Encounter (HOSPITAL_COMMUNITY): Payer: Self-pay | Admitting: Urology

## 2016-06-15 ENCOUNTER — Ambulatory Visit (HOSPITAL_COMMUNITY): Payer: Managed Care, Other (non HMO) | Admitting: Emergency Medicine

## 2016-06-15 DIAGNOSIS — M1990 Unspecified osteoarthritis, unspecified site: Secondary | ICD-10-CM | POA: Diagnosis not present

## 2016-06-15 DIAGNOSIS — J449 Chronic obstructive pulmonary disease, unspecified: Secondary | ICD-10-CM | POA: Insufficient documentation

## 2016-06-15 DIAGNOSIS — M5117 Intervertebral disc disorders with radiculopathy, lumbosacral region: Secondary | ICD-10-CM | POA: Diagnosis present

## 2016-06-15 DIAGNOSIS — E785 Hyperlipidemia, unspecified: Secondary | ICD-10-CM | POA: Diagnosis not present

## 2016-06-15 DIAGNOSIS — M4807 Spinal stenosis, lumbosacral region: Secondary | ICD-10-CM | POA: Insufficient documentation

## 2016-06-15 DIAGNOSIS — M4316 Spondylolisthesis, lumbar region: Secondary | ICD-10-CM | POA: Diagnosis not present

## 2016-06-15 DIAGNOSIS — Z419 Encounter for procedure for purposes other than remedying health state, unspecified: Secondary | ICD-10-CM

## 2016-06-15 DIAGNOSIS — M48061 Spinal stenosis, lumbar region without neurogenic claudication: Secondary | ICD-10-CM | POA: Diagnosis present

## 2016-06-15 DIAGNOSIS — F17210 Nicotine dependence, cigarettes, uncomplicated: Secondary | ICD-10-CM | POA: Insufficient documentation

## 2016-06-15 DIAGNOSIS — R7303 Prediabetes: Secondary | ICD-10-CM | POA: Insufficient documentation

## 2016-06-15 HISTORY — PX: LUMBAR LAMINECTOMY/DECOMPRESSION MICRODISCECTOMY: SHX5026

## 2016-06-15 SURGERY — LUMBAR LAMINECTOMY/DECOMPRESSION MICRODISCECTOMY 2 LEVELS
Anesthesia: General

## 2016-06-15 MED ORDER — DEXAMETHASONE 4 MG PO TABS
4.0000 mg | ORAL_TABLET | Freq: Four times a day (QID) | ORAL | Status: AC
  Administered 2016-06-15 – 2016-06-16 (×3): 4 mg via ORAL
  Filled 2016-06-15 (×3): qty 1

## 2016-06-15 MED ORDER — MIDAZOLAM HCL 5 MG/5ML IJ SOLN
INTRAMUSCULAR | Status: DC | PRN
  Administered 2016-06-15: 2 mg via INTRAVENOUS

## 2016-06-15 MED ORDER — FENTANYL CITRATE (PF) 100 MCG/2ML IJ SOLN
INTRAMUSCULAR | Status: DC | PRN
  Administered 2016-06-15 (×3): 50 ug via INTRAVENOUS
  Administered 2016-06-15: 150 ug via INTRAVENOUS
  Administered 2016-06-15 (×2): 50 ug via INTRAVENOUS

## 2016-06-15 MED ORDER — THROMBIN 20000 UNITS EX SOLR
CUTANEOUS | Status: AC
  Filled 2016-06-15: qty 20000

## 2016-06-15 MED ORDER — HYDROMORPHONE HCL 1 MG/ML IJ SOLN
INTRAMUSCULAR | Status: AC
  Administered 2016-06-15: 0.5 mg via INTRAVENOUS
  Filled 2016-06-15: qty 1

## 2016-06-15 MED ORDER — PHENOL 1.4 % MT LIQD
1.0000 | OROMUCOSAL | Status: DC | PRN
Start: 2016-06-15 — End: 2016-06-16

## 2016-06-15 MED ORDER — PHENYLEPHRINE HCL 10 MG/ML IJ SOLN
INTRAMUSCULAR | Status: DC | PRN
  Administered 2016-06-15: 40 ug via INTRAVENOUS
  Administered 2016-06-15 (×2): 80 ug via INTRAVENOUS
  Administered 2016-06-15: 40 ug via INTRAVENOUS

## 2016-06-15 MED ORDER — HYDROMORPHONE HCL 1 MG/ML IJ SOLN
0.2500 mg | INTRAMUSCULAR | Status: DC | PRN
  Administered 2016-06-15 (×3): 0.5 mg via INTRAVENOUS

## 2016-06-15 MED ORDER — CEFAZOLIN SODIUM-DEXTROSE 2-4 GM/100ML-% IV SOLN
2.0000 g | INTRAVENOUS | Status: AC
  Administered 2016-06-15: 2 g via INTRAVENOUS
  Filled 2016-06-15: qty 100

## 2016-06-15 MED ORDER — ONDANSETRON HCL 4 MG/2ML IJ SOLN
INTRAMUSCULAR | Status: DC | PRN
  Administered 2016-06-15: 4 mg via INTRAVENOUS

## 2016-06-15 MED ORDER — VECURONIUM BROMIDE 10 MG IV SOLR
INTRAVENOUS | Status: DC | PRN
  Administered 2016-06-15: 3 mg via INTRAVENOUS
  Administered 2016-06-15: 2 mg via INTRAVENOUS

## 2016-06-15 MED ORDER — METHOCARBAMOL 500 MG PO TABS
500.0000 mg | ORAL_TABLET | Freq: Four times a day (QID) | ORAL | Status: DC | PRN
  Administered 2016-06-15 – 2016-06-16 (×4): 500 mg via ORAL
  Filled 2016-06-15 (×5): qty 1

## 2016-06-15 MED ORDER — DEXTROSE 5 % IV SOLN
INTRAVENOUS | Status: DC | PRN
  Administered 2016-06-15: 09:00:00 via INTRAVENOUS

## 2016-06-15 MED ORDER — ONDANSETRON HCL 4 MG/2ML IJ SOLN: 4.0000 mg | INTRAMUSCULAR | Status: DC | PRN

## 2016-06-15 MED ORDER — SUGAMMADEX SODIUM 200 MG/2ML IV SOLN
INTRAVENOUS | Status: DC | PRN
  Administered 2016-06-15: 200 mg via INTRAVENOUS

## 2016-06-15 MED ORDER — ROCURONIUM BROMIDE 100 MG/10ML IV SOLN
INTRAVENOUS | Status: DC | PRN
  Administered 2016-06-15: 50 mg via INTRAVENOUS

## 2016-06-15 MED ORDER — OXYCODONE HCL 5 MG PO TABS
10.0000 mg | ORAL_TABLET | ORAL | Status: DC | PRN
  Administered 2016-06-15 – 2016-06-16 (×5): 10 mg via ORAL
  Filled 2016-06-15 (×5): qty 2

## 2016-06-15 MED ORDER — METOCLOPRAMIDE HCL 5 MG/ML IJ SOLN
INTRAMUSCULAR | Status: AC
  Filled 2016-06-15: qty 2

## 2016-06-15 MED ORDER — BUPIVACAINE-EPINEPHRINE (PF) 0.25% -1:200000 IJ SOLN
INTRAMUSCULAR | Status: DC | PRN
  Administered 2016-06-15: 10 mL via PERINEURAL

## 2016-06-15 MED ORDER — ARTIFICIAL TEARS OP OINT
TOPICAL_OINTMENT | OPHTHALMIC | Status: DC | PRN
  Administered 2016-06-15: 1 via OPHTHALMIC

## 2016-06-15 MED ORDER — ROCURONIUM BROMIDE 50 MG/5ML IV SOSY
PREFILLED_SYRINGE | INTRAVENOUS | Status: AC
  Filled 2016-06-15: qty 5

## 2016-06-15 MED ORDER — MENTHOL 3 MG MT LOZG
1.0000 | LOZENGE | OROMUCOSAL | Status: DC | PRN
  Filled 2016-06-15: qty 9

## 2016-06-15 MED ORDER — FENTANYL CITRATE (PF) 100 MCG/2ML IJ SOLN
INTRAMUSCULAR | Status: AC
  Filled 2016-06-15: qty 4

## 2016-06-15 MED ORDER — LIDOCAINE 2% (20 MG/ML) 5 ML SYRINGE
INTRAMUSCULAR | Status: AC
  Filled 2016-06-15: qty 5

## 2016-06-15 MED ORDER — DEXAMETHASONE SODIUM PHOSPHATE 10 MG/ML IJ SOLN
INTRAMUSCULAR | Status: AC
  Filled 2016-06-15: qty 1

## 2016-06-15 MED ORDER — DEXAMETHASONE SODIUM PHOSPHATE 10 MG/ML IJ SOLN
INTRAMUSCULAR | Status: DC | PRN
  Administered 2016-06-15: 10 mg via INTRAVENOUS

## 2016-06-15 MED ORDER — ALBUTEROL SULFATE (2.5 MG/3ML) 0.083% IN NEBU: 3.0000 mL | INHALATION_SOLUTION | Freq: Four times a day (QID) | RESPIRATORY_TRACT | Status: DC | PRN

## 2016-06-15 MED ORDER — ARTIFICIAL TEARS OP OINT
TOPICAL_OINTMENT | OPHTHALMIC | Status: AC
  Filled 2016-06-15: qty 3.5

## 2016-06-15 MED ORDER — DEXAMETHASONE SODIUM PHOSPHATE 4 MG/ML IJ SOLN: 4.0000 mg | Freq: Four times a day (QID) | INTRAMUSCULAR | Status: AC

## 2016-06-15 MED ORDER — ACETAMINOPHEN 10 MG/ML IV SOLN
INTRAVENOUS | Status: AC
  Filled 2016-06-15: qty 100

## 2016-06-15 MED ORDER — FENTANYL CITRATE (PF) 100 MCG/2ML IJ SOLN
INTRAMUSCULAR | Status: AC
  Filled 2016-06-15: qty 2

## 2016-06-15 MED ORDER — ONDANSETRON HCL 4 MG/2ML IJ SOLN
INTRAMUSCULAR | Status: AC
  Filled 2016-06-15: qty 2

## 2016-06-15 MED ORDER — METOCLOPRAMIDE HCL 5 MG/ML IJ SOLN
INTRAMUSCULAR | Status: DC | PRN
  Administered 2016-06-15: 10 mg via INTRAVENOUS

## 2016-06-15 MED ORDER — CEFAZOLIN IN D5W 1 GM/50ML IV SOLN
1.0000 g | Freq: Three times a day (TID) | INTRAVENOUS | Status: AC
  Administered 2016-06-15 (×2): 1 g via INTRAVENOUS
  Filled 2016-06-15 (×2): qty 50

## 2016-06-15 MED ORDER — GABAPENTIN 400 MG PO CAPS
1200.0000 mg | ORAL_CAPSULE | Freq: Three times a day (TID) | ORAL | Status: DC
  Administered 2016-06-15 – 2016-06-16 (×4): 1200 mg via ORAL
  Filled 2016-06-15 (×5): qty 3

## 2016-06-15 MED ORDER — SODIUM CHLORIDE 0.9% FLUSH
3.0000 mL | Freq: Two times a day (BID) | INTRAVENOUS | Status: DC
  Administered 2016-06-15: 3 mL via INTRAVENOUS

## 2016-06-15 MED ORDER — SUGAMMADEX SODIUM 200 MG/2ML IV SOLN
INTRAVENOUS | Status: AC
  Filled 2016-06-15: qty 2

## 2016-06-15 MED ORDER — OXYCODONE HCL 5 MG PO TABS: 5.0000 mg | ORAL_TABLET | Freq: Once | ORAL | Status: DC | PRN

## 2016-06-15 MED ORDER — ACETAMINOPHEN 10 MG/ML IV SOLN
INTRAVENOUS | Status: DC | PRN
  Administered 2016-06-15: 1000 mg via INTRAVENOUS

## 2016-06-15 MED ORDER — MOMETASONE FURO-FORMOTEROL FUM 200-5 MCG/ACT IN AERO
2.0000 | INHALATION_SPRAY | Freq: Two times a day (BID) | RESPIRATORY_TRACT | Status: DC
  Administered 2016-06-15 – 2016-06-16 (×2): 2 via RESPIRATORY_TRACT
  Filled 2016-06-15: qty 8.8

## 2016-06-15 MED ORDER — LACTATED RINGERS IV SOLN
INTRAVENOUS | Status: DC | PRN
  Administered 2016-06-15 (×3): via INTRAVENOUS

## 2016-06-15 MED ORDER — PROPOFOL 10 MG/ML IV BOLUS
INTRAVENOUS | Status: DC | PRN
  Administered 2016-06-15: 120 mg via INTRAVENOUS

## 2016-06-15 MED ORDER — OXYCODONE HCL 5 MG/5ML PO SOLN: 5.0000 mg | Freq: Once | ORAL | Status: DC | PRN

## 2016-06-15 MED ORDER — PHENYLEPHRINE 40 MCG/ML (10ML) SYRINGE FOR IV PUSH (FOR BLOOD PRESSURE SUPPORT)
PREFILLED_SYRINGE | INTRAVENOUS | Status: AC
  Filled 2016-06-15: qty 10

## 2016-06-15 MED ORDER — BUPIVACAINE-EPINEPHRINE (PF) 0.25% -1:200000 IJ SOLN
INTRAMUSCULAR | Status: AC
  Filled 2016-06-15: qty 30

## 2016-06-15 MED ORDER — MIDAZOLAM HCL 2 MG/2ML IJ SOLN
INTRAMUSCULAR | Status: AC
  Filled 2016-06-15: qty 2

## 2016-06-15 MED ORDER — SODIUM CHLORIDE 0.9% FLUSH: 3.0000 mL | INTRAVENOUS | Status: DC | PRN

## 2016-06-15 MED ORDER — HYDROMORPHONE HCL 1 MG/ML IJ SOLN
INTRAMUSCULAR | Status: AC
  Filled 2016-06-15: qty 1

## 2016-06-15 MED ORDER — THROMBIN 20000 UNITS EX SOLR
CUTANEOUS | Status: DC | PRN
  Administered 2016-06-15: 20 mL via TOPICAL

## 2016-06-15 MED ORDER — LACTATED RINGERS IV SOLN: INTRAVENOUS | Status: DC

## 2016-06-15 MED ORDER — SODIUM CHLORIDE 0.9 % IV SOLN: 250.0000 mL | INTRAVENOUS | Status: DC

## 2016-06-15 MED ORDER — LIDOCAINE HCL (CARDIAC) 20 MG/ML IV SOLN
INTRAVENOUS | Status: DC | PRN
  Administered 2016-06-15: 40 mg via INTRAVENOUS

## 2016-06-15 MED ORDER — MORPHINE SULFATE (PF) 4 MG/ML IV SOLN
1.0000 mg | INTRAVENOUS | Status: DC | PRN
  Filled 2016-06-15: qty 1

## 2016-06-15 MED ORDER — METHOCARBAMOL 1000 MG/10ML IJ SOLN
500.0000 mg | Freq: Four times a day (QID) | INTRAMUSCULAR | Status: DC | PRN
  Filled 2016-06-15: qty 5

## 2016-06-15 MED ORDER — PROPOFOL 10 MG/ML IV BOLUS
INTRAVENOUS | Status: AC
  Filled 2016-06-15: qty 20

## 2016-06-15 SURGICAL SUPPLY — 59 items
BNDG GAUZE ELAST 4 BULKY (GAUZE/BANDAGES/DRESSINGS) IMPLANT
BONE MATRIX VIVIGEN 5CC (Bone Implant) ×3 IMPLANT
BUR EGG ELITE 4.0 (BURR) ×2 IMPLANT
BUR EGG ELITE 4.0MM (BURR) ×1
CLOSURE WOUND 1/2 X4 (GAUZE/BANDAGES/DRESSINGS) ×1
CORDS BIPOLAR (ELECTRODE) ×3 IMPLANT
DRAPE C-ARM 42X72 X-RAY (DRAPES) IMPLANT
DRAPE POUCH INSTRU U-SHP 10X18 (DRAPES) ×3 IMPLANT
DRAPE SURG 17X11 SM STRL (DRAPES) ×3 IMPLANT
DRAPE U-SHAPE 47X51 STRL (DRAPES) ×3 IMPLANT
DRSG AQUACEL AG ADV 3.5X 6 (GAUZE/BANDAGES/DRESSINGS) ×3 IMPLANT
DURAPREP 26ML APPLICATOR (WOUND CARE) ×3 IMPLANT
ELECT BLADE 4.0 EZ CLEAN MEGAD (MISCELLANEOUS) ×3
ELECT CAUTERY BLADE 6.4 (BLADE) ×3 IMPLANT
ELECT PENCIL ROCKER SW 15FT (MISCELLANEOUS) ×3 IMPLANT
ELECT REM PT RETURN 9FT ADLT (ELECTROSURGICAL) ×3
ELECTRODE BLDE 4.0 EZ CLN MEGD (MISCELLANEOUS) ×1 IMPLANT
ELECTRODE REM PT RTRN 9FT ADLT (ELECTROSURGICAL) ×1 IMPLANT
FLOSEAL (HEMOSTASIS) IMPLANT
GLOVE BIO SURGEON STRL SZ 6.5 (GLOVE) ×2 IMPLANT
GLOVE BIO SURGEONS STRL SZ 6.5 (GLOVE) ×1
GLOVE BIOGEL PI IND STRL 6.5 (GLOVE) ×1 IMPLANT
GLOVE BIOGEL PI IND STRL 8.5 (GLOVE) ×1 IMPLANT
GLOVE BIOGEL PI INDICATOR 6.5 (GLOVE) ×2
GLOVE BIOGEL PI INDICATOR 8.5 (GLOVE) ×2
GLOVE SS BIOGEL STRL SZ 8.5 (GLOVE) ×2 IMPLANT
GLOVE SUPERSENSE BIOGEL SZ 8.5 (GLOVE) ×4
GOWN STRL REUS W/ TWL LRG LVL3 (GOWN DISPOSABLE) ×1 IMPLANT
GOWN STRL REUS W/TWL 2XL LVL3 (GOWN DISPOSABLE) ×6 IMPLANT
GOWN STRL REUS W/TWL LRG LVL3 (GOWN DISPOSABLE) ×2
KIT BASIN OR (CUSTOM PROCEDURE TRAY) ×3 IMPLANT
NEEDLE 22X1 1/2 (OR ONLY) (NEEDLE) ×3 IMPLANT
NEEDLE SPNL 18GX3.5 QUINCKE PK (NEEDLE) ×6 IMPLANT
NS IRRIG 1000ML POUR BTL (IV SOLUTION) ×3 IMPLANT
PACK LAMINECTOMY ORTHO (CUSTOM PROCEDURE TRAY) ×3 IMPLANT
PACK UNIVERSAL I (CUSTOM PROCEDURE TRAY) ×3 IMPLANT
PATTIES SURGICAL .5 X.5 (GAUZE/BANDAGES/DRESSINGS) IMPLANT
PATTIES SURGICAL .5 X1 (DISPOSABLE) ×6 IMPLANT
SPONGE LAP 4X18 X RAY DECT (DISPOSABLE) ×6 IMPLANT
SPONGE SURGIFOAM ABS GEL 100 (HEMOSTASIS) ×3 IMPLANT
STAPLER VISISTAT 35W (STAPLE) IMPLANT
STRIP CLOSURE SKIN 1/2X4 (GAUZE/BANDAGES/DRESSINGS) ×2 IMPLANT
SURGIFLO TRUKIT (HEMOSTASIS) ×3 IMPLANT
SURGIFLO W/THROMBIN 8M KIT (HEMOSTASIS) ×3 IMPLANT
SUT BONE WAX W31G (SUTURE) ×3 IMPLANT
SUT MON AB 3-0 SH 27 (SUTURE) ×2
SUT MON AB 3-0 SH27 (SUTURE) ×1 IMPLANT
SUT VIC AB 1 CT1 18XCR BRD 8 (SUTURE) ×1 IMPLANT
SUT VIC AB 1 CT1 27 (SUTURE) ×4
SUT VIC AB 1 CT1 27XBRD ANTBC (SUTURE) ×2 IMPLANT
SUT VIC AB 1 CT1 8-18 (SUTURE) ×2
SUT VIC AB 2-0 CT1 18 (SUTURE) ×6 IMPLANT
SUT VICRYL 0 UR6 27IN ABS (SUTURE) ×3 IMPLANT
SYR BULB IRRIGATION 50ML (SYRINGE) ×3 IMPLANT
SYR CONTROL 10ML LL (SYRINGE) ×3 IMPLANT
TOWEL OR 17X26 10 PK STRL BLUE (TOWEL DISPOSABLE) ×6 IMPLANT
TRAY FOLEY CATH 16FRSI W/METER (SET/KITS/TRAYS/PACK) ×3 IMPLANT
WATER STERILE IRR 1000ML POUR (IV SOLUTION) ×3 IMPLANT
YANKAUER SUCT BULB TIP NO VENT (SUCTIONS) ×3 IMPLANT

## 2016-06-15 NOTE — Brief Op Note (Signed)
06/15/2016  11:34 AM  PATIENT:  Ann Richardson  55 y.o. 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, stenosis lower extremity pain  PROCEDURE:  Procedure(s): L4-5, L5-S1 Decompression 2 LEVELS, InSitu FUSION L4-L5 (N/A)  SURGEON:  Surgeon(s) and Role:    * Venita Lickahari Lukus Binion, MD - Primary  PHYSICIAN ASSISTANT:   ASSISTANTS: Carmen Mayo   ANESTHESIA:   general  EBL:  Total I/O In: 1600 [I.V.:1600] Out: 600 [Urine:500; Blood:100]  BLOOD ADMINISTERED:none  DRAINS: none   LOCAL MEDICATIONS USED:  MARCAINE     SPECIMEN:  No Specimen  DISPOSITION OF SPECIMEN:  N/A  COUNTS:  YES  TOURNIQUET:  * No tourniquets in log *  DICTATION: .Other Dictation: Dictation Number [id number]  PLAN OF CARE: Admit to inpatient   PATIENT DISPOSITION:  PACU - hemodynamically stable.

## 2016-06-15 NOTE — Anesthesia Procedure Notes (Signed)
Procedure Name: Intubation Date/Time: 06/15/2016 8:42 AM Performed by: Wray KearnsFOLEY, Zamani Crocker A Pre-anesthesia Checklist: Patient identified, Emergency Drugs available, Suction available and Patient being monitored Patient Re-evaluated:Patient Re-evaluated prior to inductionOxygen Delivery Method: Circle System Utilized Preoxygenation: Pre-oxygenation with 100% oxygen Intubation Type: IV induction and Cricoid Pressure applied Ventilation: Mask ventilation without difficulty Laryngoscope Size: Mac and 3 Grade View: Grade II Tube type: Oral Tube size: 7.5 mm Number of attempts: 1 Airway Equipment and Method: Stylet Placement Confirmation: ETT inserted through vocal cords under direct vision,  positive ETCO2 and breath sounds checked- equal and bilateral Secured at: 22 cm Tube secured with: Tape Dental Injury: Teeth and Oropharynx as per pre-operative assessment

## 2016-06-15 NOTE — Transfer of Care (Signed)
Immediate Anesthesia Transfer of Care Note  Patient: Ann Richardson  Procedure(s) Performed: Procedure(s): L4-5, L5-S1 Decompression 2 LEVELS, InSitu FUSION L4-L5 (N/A)  Patient Location: PACU  Anesthesia Type:General  Level of Consciousness: awake, oriented, sedated, patient cooperative and responds to stimulation  Airway & Oxygen Therapy: Patient Spontanous Breathing and Patient connected to face mask oxygen  Post-op Assessment: Report given to RN, Post -op Vital signs reviewed and stable, Patient moving all extremities and Patient moving all extremities X 4  Post vital signs: Reviewed and stable  Last Vitals:  Vitals:   06/15/16 0648 06/15/16 1154  BP: (!) 158/82   Pulse: 97   Resp: 18   Temp: 36.5 C (P) 36.3 C    Last Pain:  Vitals:   06/15/16 1154  TempSrc:   PainSc: (P) 7          Complications: No apparent anesthesia complications

## 2016-06-15 NOTE — H&P (Signed)
History of Present Illness  The patient is a 55 year old female who comes in today for a preoperative History and Physical. The patient is scheduled for a L4-S1 Lumbar Decompression to be performed by Dr. Debria Garretahari D. Shon BatonBrooks, MD [facility] on 06/15/2016 . Please see the hospital record for complete dictated history and physical. Pt reports smoking 1/2 pack a day. she is working on cessation. Pt has COPD.  Problem List/Past Medical  Degenerative lumbar spinal stenosis (M48.061)  Spondylolisthesis at L4-L5 level (M43.16)  Lumbar radiculitis (M54.16)  Problems Reconciled   Allergies No Known Drug Allergies [03/29/2016]: Allergies Reconciled   Family History Cancer  Mother. Chronic Obstructive Lung Disease  Mother. Congestive Heart Failure  Mother. Diabetes Mellitus  Mother. First Degree Relatives  reported Heart Disease  Mother. Heart disease in female family member before age 56   Social History  Tobacco / smoke exposure  03/22/2016: yes outdoors only Tobacco use  Current every day smoker. 03/22/2016: smoke(d) 3 or more pack(s) per day uses less than 1/2 can(s) smokeless per week Children  2 Current drinker  03/22/2016: Currently drinks beer less than 5 times per week Current work status  working full time Exercise  Exercises rarely; does running / walking Living situation  live with partner Marital status  partnered No history of drug/alcohol rehab  Not under pain contract  Number of flights of stairs before winded  2-3  Medication History  Ventolin HFA (108 (90 Base)MCG/ACT Aerosol Soln, Inhalation) Active. (prn) Symbicort (160-4.5MCG/ACT Aerosol, Inhalation) Active. (bid for COPD) Atorvastatin Calcium (10MG  Tablet, Oral) Active. (5mg  1/2 tab qd) Gabapentin (600MG  Tablet, Oral) Active. (1200mg  tid) OxyCODONE HCl (5MG  Tablet, Oral) Active. Cyclobenzaprine HCl (5MG  Tablet, Oral) Active. (??mg) Medications Reconciled  Vitals   06/07/2016 8:41 AM Weight: 131.38 lb Height: 60in Body Surface Area: 1.56 m Body Mass Index: 25.66 kg/m  Temp.: 98.28F  Pulse: 101 (Regular)  BP: 132/87 (Sitting, Left Arm, Standard)  General General Appearance-Not in acute distress. Orientation-Oriented X3. Build & Nutrition-Well nourished and Well developed.  Integumentary General Characteristics Surgical Scars - no surgical scar evidence of previous lumbar surgery. Lumbar Spine-Skin examination of the lumbar spine is without deformity, skin lesions, lacerations or abrasions.  Chest and Lung Exam Auscultation Breath sounds - Normal and Clear.  Cardiovascular Auscultation Rhythm - Regular rate and rhythm.  Abdomen Palpation/Percussion Palpation and Percussion of the abdomen reveal - Soft, Non Tender and No Rebound tenderness.  Peripheral Vascular Lower Extremity Palpation - Posterior tibial pulse - Bilateral - 2+. Dorsalis pedis pulse - Bilateral - 2+.  Neurologic Sensation Lower Extremity - Bilateral - sensation is intact in the lower extremity. Reflexes Patellar Reflex - Bilateral - 2+. Achilles Reflex - Bilateral - 2+. Clonus - Bilateral - clonus not present. Hoffman's Sign - Bilateral - Hoffman's sign not present. Testing Seated Straight Leg Raise - Bilateral - Seated straight leg raise negative.  Musculoskeletal Spine/Ribs/Pelvis  Lumbosacral Spine: Inspection and Palpation - Tenderness - left lumbar paraspinals tender to palpation, right lumbar paraspinals tender to palpation and left buttock is tender to palpation. Strength and Tone: Strength - Hip Flexion - Bilateral - 5/5. Knee Extension - Bilateral - 5/5. Knee Flexion - Bilateral - 5/5. Ankle Dorsiflexion - Left - 3+/5. Right - 5/5. Ankle Plantarflexion - Bilateral - 5/5. Heel walk - Bilateral - able to heel walk with great difficulty. Toe Walk - Bilateral - able to walk on toes with great difficulty. ROM - Flexion - moderately decreased  range  of motion and painful. Extension - moderately decreased range of motion and painful. Left Lateral Bending - moderately decreased range of motion and painful. Right Lateral Bending - moderately decreased range of motion and painful. Right Rotation - moderately decreased range of motion and painful. Left Rotation - moderately decreased range of motion and painful. Pain - . Lumbosacral Spine - Waddell's Signs - no Waddell's signs present. Lower Extremity Range of Motion - No true hip, knee or ankle pain with range of motion. Gait and Station - [employer] - cane.  MRI was significant for minimal retrolisthesis at L3-4 and grade 1 to 2 anterolisthesis of L4 on 5, more pronounced on the left moderate to severe and disc height loss, moderate disc osteophyte complex, impingement on exiting right L4 nerve roots, severe bilateral facet arthrosis, minimal retrolisthesis at L5 on S1 and moderate severe disc height loss, disc osteophyte complex, impinging upon descending right S1 nerve and small left foraminal, extraforaminal disc extrusion with mild cranial migration, moderate on the left, mild on the right foraminal narrowing, impingement on the exiting left L5 nerve root.  Assessment & Plan  Posterior Lumbar Decompression/disectomy: Risks of surgery include infection, bleeding, nerve damage, death, stroke, paralysis, failure to heal, need for further surgery, ongoing or worse pain, need for further surgery, CSF leak, loss of bowel or bladder, and recurrent disc herniation or Stenosis which would necessitate need for further surgery.  Goal Of Surgery: Discussed that goal of surgery is to reduce pain and improve function and quality of life. Patient is aware that despite all appropriate treatment that there pain and function could be the same, worse, or different.

## 2016-06-15 NOTE — Evaluation (Signed)
Physical Therapy Evaluation Patient Details Name: Ann Richardson MRN: [id number] DOB: [birth year] Today's Date: 06/15/2016   History of Present Illness  Pt is a 55 y/o female s/p L4-L5/L5-S1 decompression and L4-L5 in situ fusion sx. PMH including but not limited to COPD.  Clinical Impression  Pt presented supine in bed with HOB elevated, awake and willing to participate in therapy session. Prior to admission, pt reported that she was mod I with use of SPC to ambulate with for approximately one month secondary to pain. Pt currently at min guard for safety with functional mobility, no physical assistance required. Pt's significant other was present throughout entire session and will be at home with pt for 24/7 supervision. PT gave pt handout and reviewed 3/3 back precautions. No further acute PT needs identified at this time. PT signing off.     Follow Up Recommendations No PT follow up;Supervision for mobility/OOB    Equipment Recommendations  None recommended by PT    Recommendations for Other Services       Precautions / Restrictions Precautions Precautions: Back Precaution Booklet Issued: Yes (comment) Precaution Comments: PT reviewed 3/3 precautions with pt and gave her the back precautions handout. Required Braces or Orthoses: Spinal Brace Spinal Brace: Lumbar corset Restrictions Weight Bearing Restrictions: No      Mobility  Bed Mobility Overal bed mobility: Needs Assistance Bed Mobility: Rolling;Sidelying to Sit;Sit to Sidelying Rolling: Modified independent (Device/Increase time) Sidelying to sit: Min guard     Sit to sidelying: Supervision General bed mobility comments: pt required increased time, VC'ing for log roll technique and min guard to supervision for safety. No physical assistance was required.  Transfers Overall transfer level: Needs assistance Equipment used: None Transfers: Sit to/from Stand Sit to Stand: Min guard         General transfer  comment: pt required increased time, min guard for safety  Ambulation/Gait Ambulation/Gait assistance: Supervision Ambulation Distance (Feet): 200 Feet Assistive device: None Gait Pattern/deviations: Step-through pattern;Decreased stride length;Antalgic Gait velocity: decreased Gait velocity interpretation: Below normal speed for age/gender General Gait Details: pt with slight antalgic gait pattern secondary to L foot pain. However, no instability or LOB noted.  Stairs Stairs: Yes Stairs assistance: Min guard Stair Management: One rail Right;Step to pattern;Forwards Number of Stairs: 8 General stair comments: no instability or LOB, min guard for safety and use of unilateral hand rail  Wheelchair Mobility    Modified Rankin (Stroke Patients Only)       Balance Overall balance assessment: Needs assistance Sitting-balance support: Feet supported;No upper extremity supported Sitting balance-Leahy Scale: Good Sitting balance - Comments: min A to don/doff lumbar brace   Standing balance support: During functional activity;No upper extremity supported Standing balance-Leahy Scale: Fair                               Pertinent Vitals/Pain Pain Assessment: Faces Faces Pain Scale: Hurts little more Pain Location: back Pain Descriptors / Indicators: Sore Pain Intervention(s): Monitored during session;Repositioned    Home Living Family/patient expects to be discharged to:: Private residence Living Arrangements: Spouse/significant other Available Help at Discharge: Family;Available 24 hours/day Type of Home: House (Townhouse) Home Access: Level entry     Home Layout: Two level Home Equipment: Cane - single point      Prior Function Level of Independence: Independent with assistive device(s)         Comments: pt reported that she had been ambulating with use  of SPC for approximately one month prior to admission.     Hand Dominance         Extremity/Trunk Assessment   Upper Extremity Assessment Upper Extremity Assessment: Overall WFL for tasks assessed    Lower Extremity Assessment Lower Extremity Assessment: Overall WFL for tasks assessed    Cervical / Trunk Assessment Cervical / Trunk Assessment: Other exceptions Cervical / Trunk Exceptions: s/p lumbar spine sx  Communication   Communication: No difficulties  Cognition Arousal/Alertness: Awake/alert Behavior During Therapy: WFL for tasks assessed/performed Overall Cognitive Status: Within Functional Limits for tasks assessed                      General Comments      Exercises     Assessment/Plan    PT Assessment Patent does not need any further PT services  PT Problem List            PT Treatment Interventions      PT Goals (Current goals can be found in the Care Plan section)  Acute Rehab PT Goals Patient Stated Goal: return home    Frequency     Barriers to discharge        Co-evaluation               End of Session Equipment Utilized During Treatment: Gait belt;Back brace Activity Tolerance: Patient limited by pain Patient left: in bed;with call bell/phone within reach;with family/visitor present Nurse Communication: Mobility status    Functional Assessment Tool Used: clinical judgement Functional Limitation: Mobility: Walking and moving around Mobility: Walking and Moving Around Current Status ([id number](G8978): At least 1 percent but less than 20 percent impaired, limited or restricted Mobility: Walking and Moving Around Goal Status [phone](G8979): 0 percent impaired, limited or restricted Mobility: Walking and Moving Around Discharge Status [phone](G8980): At least 1 percent but less than 20 percent impaired, limited or restricted    Time: [id number] PT Time Calculation (min) (ACUTE ONLY): 23 min   Charges:   PT Evaluation $PT Eval Low Complexity: 1 Procedure PT Treatments $Gait Training: 8-22 mins   PT G Codes:   PT G-Codes **NOT  FOR INPATIENT CLASS** Functional Assessment Tool Used: clinical judgement Functional Limitation: Mobility: Walking and moving around Mobility: Walking and Moving Around Current Status (M0102(G8978): At least 1 percent but less than 20 percent impaired, limited or restricted Mobility: Walking and Moving Around Goal Status [phone]([id number]): 0 percent impaired, limited or restricted Mobility: Walking and Moving Around Discharge Status [phone](G8980): At least 1 percent but less than 20 percent impaired, limited or restricted    [facility] CenterJennifer M Vollie Richardson 06/15/2016, 6:08 PM Deborah ChalkJennifer Alicianna Richardson, PT, DPT [phone]

## 2016-06-16 ENCOUNTER — Encounter (HOSPITAL_COMMUNITY): Payer: Self-pay | Admitting: Orthopedic Surgery

## 2016-06-16 DIAGNOSIS — M5117 Intervertebral disc disorders with radiculopathy, lumbosacral region: Secondary | ICD-10-CM | POA: Diagnosis not present

## 2016-06-16 MED ORDER — OXYCODONE HCL 15 MG PO TABS: 15.0000 mg | ORAL_TABLET | ORAL | 0 refills | Status: AC | PRN

## 2016-06-16 MED ORDER — OXYCODONE HCL 5 MG PO TABS
15.0000 mg | ORAL_TABLET | ORAL | Status: DC | PRN
Start: 2016-06-16 — End: 2016-06-16
  Administered 2016-06-16 (×2): 15 mg via ORAL
  Filled 2016-06-16 (×2): qty 3

## 2016-06-16 MED ORDER — ONDANSETRON 4 MG PO TBDP: 4.0000 mg | ORAL_TABLET | Freq: Three times a day (TID) | ORAL | 0 refills | Status: DC | PRN

## 2016-06-16 NOTE — Anesthesia Preprocedure Evaluation (Signed)
Anesthesia Evaluation  Patient identified by MRN, date of birth, ID band Patient awake    Reviewed: Allergy & Precautions, NPO status , Patient's Chart, lab work & pertinent test results  History of Anesthesia Complications Negative for: history of anesthetic complications  Airway Mallampati: II  TM Distance: >3 FB Neck ROM: Full    Dental  (+) Dental Advisory Given   Pulmonary COPD,  COPD inhaler, Current Smoker,    breath sounds clear to auscultation       Cardiovascular negative cardio ROS   Rhythm:Regular     Neuro/Psych  Neuromuscular disease negative psych ROS   GI/Hepatic negative GI ROS, Neg liver ROS,   Endo/Other    Renal/GU negative Renal ROS     Musculoskeletal  (+) Arthritis ,   Abdominal   Peds  Hematology negative hematology ROS (+)   Anesthesia Other Findings   Reproductive/Obstetrics                             Anesthesia Physical Anesthesia Plan  ASA: II  Anesthesia Plan: General   Post-op Pain Management:    Induction: Intravenous  Airway Management Planned: Oral ETT  Additional Equipment: None  Intra-op Plan:   Post-operative Plan: Extubation in OR  Informed Consent: I have reviewed the patients History and Physical, chart, labs and discussed the procedure including the risks, benefits and alternatives for the proposed anesthesia with the patient or authorized representative who has indicated his/her understanding and acceptance.   Dental advisory given  Plan Discussed with: CRNA and Surgeon  Anesthesia Plan Comments:         Anesthesia Quick Evaluation

## 2016-06-16 NOTE — Op Note (Signed)
[facility]            ACCOUNT NO.:  [id number]  MEDICAL RECORD NO.:  [id number]  LOCATION:  [facility]                         FACILITY:  [facility]  PHYSICIAN:  Daxtyn Rottenberg D. Shon BatonBrooks, M.D. DATE OF BIRTH:  Oct 03, 1960  DATE OF PROCEDURE:  06/15/2016 DATE OF DISCHARGE:                              OPERATIVE REPORT   PREOPERATIVE DIAGNOSES: 1. Lumbar spinal stenosis with degenerative spondylolisthesis L4-5. 2. 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  POSTOPERATIVE DIAGNOSES: 1. Lumbar spinal stenosis with degenerative spondylolisthesis L4-5. 2. 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  OPERATIVE PROCEDURE:  L4-5, L5-S1 decompression with partial facetectomy and pars resection with foraminotomies at L4 and diskectomy L5-S1 and posterolateral in-situ arthrodesis, L4-5.  COMPLICATIONS:  None.  CONDITION:  Stable.  FIRST ASSISTANT:  Anette Riedelarmen Mayo, [place].  HISTORY:  This is a very pleasant 55 year old woman who presents to my office with severe debilitating back, buttock, and neuropathic left leg pain and intermittent right leg pain.  Attempts at conservative management had failed.  After reviewing her MRI, the patient had primarily neuropathic pain secondary to her spinal stenosis.  In preoperative discussions, she was adamant about not having a fusion and I was concerned about the spondylolisthesis, although a low grade at L4- 5.  At that point, I indicated that we can move forward with a decompression, but if I needed to widen the decompression or perform a foraminotomy to adequately decompress the nerve, then I may move forward with an in situ fusion to decrease the chance of iatrogenic instability and worsening of her slip.  The patient understood all the risks and benefits and consented to surgery.  OPERATIVE NOTE:  The patient was brought to the operating room and placed supine on the operating table.  After successful induction of general anesthesia  and endotracheal intubation, TEDs, SCDs, and a Foley were inserted.  She was turned prone onto the [place] frame.  All bony prominences were well padded and the back was prepped and draped in a standard fashion.  Time-out was taken confirming the patient, procedure, and all other pertinent important data.  Once this was done, I placed 2 needles into the back to mark out my incision site.  Once this was done, I infiltrated the incision site with 0.25% Marcaine and then made a midline incision.  Sharp dissection was carried out down to the deep fascia.  The deep fascia was incised and I stripped the paraspinal muscles to expose the L4-5 and L5-S1 facet complex and the L4 and L5 transverse processes.  Once this was done, hemostasis was obtained and I placed a Penfield 4 underneath the lamina of L4.  I took an intraoperative x-ray confirming my level.  Once this was done, I then removed the posterior spinous process of L5 with a double-action Leksell rongeur and I removed the majority of the L4 spinous process with the double-action Leksell rongeur.  Since there was no significant neural compression or stenosis on the left side at L5-S1, I did not do an aggressive left lateral recess decompression.  Instead, I simply developed a plane underneath the lamina and used my Kerrison rongeur to perform a central laminectomy of L5.  This allowed me to  gain access into the lateral recess.  At this time, I went on the right.  I did do an aggressive lateral recess decompression removing very thickened ligamentum flavum.  I then identified the S1 nerve root, traced it towards the foramen and made sure it is adequately decompressed.  I then continued my lateral recess decompression until I could visualize the L5 pedicle.  At this point, I could now see the disk herniation in the posterolateral right corner.  I then continued my central decompression removing the majority of the subtotal laminotomy of L4  with my Kerrison punch.  At this point, I noted plaque formation indicative of chronic compression on the thecal sac and significant neural compression.  I then decompressed the lateral recess again with my Kerrison rongeurs and again noted that the L4 nerve root was significantly compressed in the foramen.  This was more so on the left-hand side.  As such, I traced the L4 nerve root around the pedicle and into the foramen and used my Kerrison rongeur to resect the undersurface of the pars to adequately decompress the nerve.  At this point, once the decompression was done, I could easily take my [facility] elevator, pass it superiorly so I could palpate the medial border of the L4 pedicle.  I could easily palpate superiorly towards the L3-4 disk.  I identified the L4 nerve root and I was easily able to go into the L4 foramen.  I then was able to trace down on the left side and freely go out the L5 and S1 foramina as well. At this point, the left side decompression was complete, I then checked the right and again I had an adequate decompression.  I then isolated the L5-S1 disk fragment on the right side.  An annulotomy was performed with a 15-blade scalpel and then using pituitary rongeurs and curettes, I removed the fragment of disk.  This was mostly a calcified osteophyte. I then used Epstein curettes to debride this.  I gently tried to remove the calcified anulus, but it was quite adherent to the thecal sac.  With an adequate decompression, I did not want to cause a ventral CSF dural tear with CSF leak and so I removed what I could safely remove and left the remaining.  At the conclusion, the S1 nerve root was no longer under tension.  There was no significant ongoing neural compression.  At this point, I irrigated the wound copiously with normal saline. Because of the need for the L4 foraminotomy and resection of the pars and her underlying instability pattern, I elected to move forward  with the in-situ fusion.  A high-speed bur was used to decorticate the lateral facet complex and the transverse process.  I then removed the capsule of the L4-5 facet complex and then placed the bone graft that I had harvested from the decompression along with 5 mL of ViviGen as a supplement.  These was placed in the posterolateral gutter.  I irrigated the wound copiously with normal saline and made sure I had hemostasis using bipolar electrocautery and FloSeal.  With hemostasis obtained, I then closed in a layered fashion with interrupted #1 Vicryl sutures for the deep fascia, 2-0 Vicryl sutures and 3-0 Monocryl.  Steri-Strips and dry dressings were applied.  The patient was extubated, transferred to PACU without incident.  At the end of the case, all needle and sponge counts were correct.  There were no adverse intraoperative events.     Dasja Brase D. Shon BatonBrooks, M.D.  DDB/MEDQ  D:  06/15/2016  T:  06/16/2016  Job:  [id number]  cc:   Dr. Shon Baton' office

## 2016-06-16 NOTE — Evaluation (Signed)
Occupational Therapy Evaluation and Discharge Patient Details Name: Isac CaddyMadeline M Cornelio MRN: [id number] DOB: 1961-01-14 Today's Date: 06/16/2016    History of Present Illness Pt is a 55 y/o female s/p L4-L5/L5-S1 decompression and L4-L5 in situ fusion sx. PMH including but not limited to COPD.   Clinical Impression   This 55 yo female admitted and underwent above presents to acute OT with all education completed, we will D/C from acute OT.    Follow Up Recommendations  No OT follow up    Equipment Recommendations  3 in 1 bedside commode       Precautions / Restrictions Precautions Precautions: Back Required Braces or Orthoses: Spinal Brace Spinal Brace: Lumbar corset;Applied in sitting position Restrictions Weight Bearing Restrictions: No      Mobility Bed Mobility               General bed mobility comments: pt up in recliner upon my arrival          ADL Overall ADL's : Needs assistance/impaired Eating/Feeding: Independent;Sitting   Grooming: Set up;Sitting Grooming Details (indicate cue type and reason): Educated on use of 2 cups for brushing teeth (one to spit and one rinse) Upper Body Bathing: Set up;Sitting   Lower Body Bathing: Moderate assistance   Upper Body Dressing : Set up;Sitting   Lower Body Dressing: Moderate assistance Lower Body Dressing Details (indicate cue type and reason): Edcuated pt and partner on most effcient way of getting dressed       Toileting - Clothing Manipulation Details (indicate cue type and reason): Educate pt and partner on use of wet wipes for back peri-hygiene   Tub/Shower Transfer Details (indicate cue type and reason): Educated pt and partner on side stepping into tub                   Pertinent Vitals/Pain Pain Assessment: Faces Faces Pain Scale: Hurts even more Pain Location: back Pain Descriptors / Indicators: Sore Pain Intervention(s): Monitored during session;Repositioned;Premedicated before  session     Hand Dominance Right   Extremity/Trunk Assessment Upper Extremity Assessment Upper Extremity Assessment: Overall WFL for tasks assessed           Communication Communication Communication: No difficulties   Cognition Arousal/Alertness: Awake/alert Behavior During Therapy: WFL for tasks assessed/performed Overall Cognitive Status: Within Functional Limits for tasks assessed                                Home Living Family/patient expects to be discharged to:: Private residence Living Arrangements: Spouse/significant other Available Help at Discharge: Family;Available 24 hours/day Type of Home: House Home Access: Level entry     Home Layout: Two level Alternate Level Stairs-Number of Steps: 13 Alternate Level Stairs-Rails: Right Bathroom Shower/Tub: Tub/shower unit;Curtain Shower/tub characteristics: Engineer, building servicesCurtain Bathroom Toilet: Standard     Home Equipment: Cane - single point          Prior Functioning/Environment Level of Independence: Independent with assistive device(s)        Comments: pt reported that she had been ambulating with use of SPC for approximately one month prior to admission.              OT Goals(Current goals can be found in the care plan section) Acute Rehab OT Goals Patient Stated Goal: to go home  OT Frequency:                End of Session Nurse  Communication:  (Pt needs a 3n1)  Activity Tolerance: Patient tolerated treatment well Patient left: in chair;with call bell/phone within reach;with family/visitor present   Time: [id number] OT Time Calculation (min): 21 min Charges:  OT General Charges $OT Visit: 1 Procedure OT Evaluation $OT Eval Moderate Complexity: 1 Procedure G-Codes: OT G-codes **NOT FOR INPATIENT CLASS** Functional Assessment Tool Used: Clinical observation Functional Limitation: Self care Self Care Current Status (V4098(G8987): At least 40 percent but less than 60 percent impaired, limited  or restricted Self Care Goal Status (J1914(G8988): At least 40 percent but less than 60 percent impaired, limited or restricted Self Care Discharge Status [phone](G8989): At least 40 percent but less than 60 percent impaired, limited or restricted  Evette GeorgesLeonard, Juliana Boling Eva [phone] 06/16/2016, 10:03 AM

## 2016-06-16 NOTE — Progress Notes (Signed)
    Subjective: 1 Day Post-Op Procedure(s) (LRB): L4-5, L5-S1 Decompression 2 LEVELS, InSitu FUSION L4-L5 (N/A) Patient reports pain as 7 on 0-10 scale.  Increases with ambulation Denies CP or SOB.  Voiding without difficulty. Positive flatus. Ambulating in hall. Objective: Vital signs in last 24 hours: Temp:  [97.3 F (36.3 C)-98.9 F (37.2 C)] 98.9 F (37.2 C) (12/21 0435) Pulse Rate:  [92-103] 96 (12/21 0435) Resp:  [10-18] 18 (12/21 0435) BP: (104-137)/(56-89) 104/65 (12/21 0435) SpO2:  [88 %-98 %] 97 % (12/21 0435)  Intake/Output from previous day: 12/20 0701 - 12/21 0700 In: 3470 [P.O.:720; I.V.:2700; IV Piggyback:50] Out: 700 [Urine:500; Blood:200] Intake/Output this shift: No intake/output data recorded.  Labs: No results for input(s): HGB in the last 72 hours. No results for input(s): WBC, RBC, HCT, PLT in the last 72 hours. No results for input(s): NA, K, CL, CO2, BUN, CREATININE, GLUCOSE, CALCIUM in the last 72 hours. No results for input(s): LABPT, INR in the last 72 hours.  Physical Exam: Neurologically intact ABD soft Sensation intact distally Dorsiflexion/Plantar flexion intact Incision: no drainage Compartment soft  Assessment/Plan: 1 Day Post-Op Procedure(s) (LRB): L4-5, L5-S1 Decompression 2 LEVELS, InSitu FUSION L4-L5 (N/A) Advance diet Up with therapy  Consider DC today  Nikia Levels, Baxter Kailarmen Christina for Dr. Venita Lickahari Brooks [facility] [phone] 06/16/2016, 7:00 AM    Patient ID: Ann Richardson, female   DOB: February 20, 1961, 55 y.o.   MRN: [id number]

## 2016-06-16 NOTE — Anesthesia Postprocedure Evaluation (Signed)
Anesthesia Post Note  Patient: Ann Richardson  Procedure(s) Performed: Procedure(s) (LRB): L4-5, L5-S1 Decompression 2 LEVELS, InSitu FUSION L4-L5 (N/A)  Patient location during evaluation: PACU Anesthesia Type: General Level of consciousness: awake and alert Pain management: pain level controlled Vital Signs Assessment: post-procedure vital signs reviewed and stable Respiratory status: spontaneous breathing, nonlabored ventilation, respiratory function stable and patient connected to nasal cannula oxygen Cardiovascular status: blood pressure returned to baseline and stable Postop Assessment: no signs of nausea or vomiting Anesthetic complications: no       Last Vitals:  Vitals:   06/16/16 0804 06/16/16 1224  BP: 122/76 120/76  Pulse: 98 98  Resp: 18 16  Temp: 37.4 C 37.2 C    Last Pain:  Vitals:   06/16/16 0538  TempSrc:   PainSc: 7                  Rhett Mutschler

## 2016-06-17 NOTE — Progress Notes (Signed)
Pt. discharged home accompanied by significant other. Prescriptions and discharge instructions given with verbalization of understanding. Incision site on back with no s/s of infection - no swelling, redness, bleeding, and/or drainage noted. Extra dressing given for home use. Aspen back brace in place. Pain med given before leaving. Opportunity given to ask questions but no question asked. Pt. transported out of this unit in wheelchair by the nurse tech. Patient educated about incision care. Patient has appointment with Dr. Shon BatonBrooks in 2 weeks.

## 2016-06-22 NOTE — Discharge Summary (Signed)
Physician Discharge Summary  Patient ID: Ann Richardson MRN: [id number] DOB/AGE: [id number] y.o.  Admit date: 06/15/2016 Discharge date: 06/22/2016  Admission Diagnoses:  Lumbar spinal stenosis with Spondylolisthesis at L4-5  Discharge Diagnoses:  Active Problems:   Spinal stenosis, lumbar   Past Medical History:  Diagnosis Date  . Arthritis    spine- degenerative   . COPD (chronic obstructive pulmonary disease) (HCC)    CXR- 05/2016  . Hyperlipidemia   . 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 Pre-diabetes    pt. reports last A1C - 5/5, had been higher at one time     Surgeries: Procedure(s): L4-5, L5-S1 Decompression 2 LEVELS, InSitu FUSION L4-L5 on 06/15/2016   Consultants (if any):   Discharged Condition: Improved  Hospital Course: Ann CaddyMadeline M Hagemann is an 55 y.o. female who was admitted 06/15/2016 with a diagnosis of Lumbar spinal stenosis with Spondylolisthesis and went to the operating room on 06/15/2016 and underwent the above named procedures.  Post op day 1 pt ambulating in the hall.  Pt voiding w/o difficulty. Pt reports her pain level increases with activity.  Pt cleared by OT to DC.  She was given perioperative antibiotics:  Anti-infectives    Start     Dose/Rate Route Frequency Ordered Stop   06/15/16 1630  ceFAZolin (ANCEF) IVPB 1 g/50 mL premix     1 g 100 mL/hr over 30 Minutes Intravenous Every 8 hours 06/15/16 1330 06/16/16 0016   06/15/16 0604  ceFAZolin (ANCEF) IVPB 2g/100 mL premix     2 g 200 mL/hr over 30 Minutes Intravenous 30 min pre-op 06/15/16 0604 06/15/16 0900    .  She was given sequential compression devices, early ambulation, and TED for DVT prophylaxis.  She benefited maximally from the hospital stay and there were no complications.    Recent vital signs:  Vitals:   06/16/16 0804 06/16/16 1224  BP: 122/76 120/76  Pulse: 98 98  Resp: 18 16  Temp: 99.3 F (37.4 C) 99 F (37.2 C)    Recent  laboratory studies:  Lab Results  Component Value Date   HGB 15.3 (H) 06/09/2016   Lab Results  Component Value Date   WBC 11.1 (H) 06/09/2016   PLT 253 06/09/2016   No results found for: INR Lab Results  Component Value Date   NA 139 06/09/2016   K 4.2 06/09/2016   CL 103 06/09/2016   CO2 28 06/09/2016   BUN 12 06/09/2016   CREATININE 0.68 06/09/2016   GLUCOSE 97 06/09/2016    Discharge Medications:   Allergies as of 06/16/2016   No Known Allergies     Medication List    TAKE these medications   albuterol 108 (90 Base) MCG/ACT inhaler Commonly known as:  PROVENTIL HFA;VENTOLIN HFA Inhale 2 puffs into the lungs every 6 (six) hours as needed for wheezing or shortness of breath.   atorvastatin 10 MG tablet Commonly known as:  LIPITOR Take 5 mg by mouth daily at 6 PM.   cyclobenzaprine 10 MG tablet Commonly known as:  FLEXERIL Take 1 tablet by mouth 3 (three) times daily as needed.   gabapentin 300 MG capsule Commonly known as:  NEURONTIN Take 4 capsules by mouth 3 (three) times daily.   naproxen sodium 220 MG tablet Commonly known as:  ANAPROX Take 440 mg by mouth 2 (two) times daily with a meal.   ondansetron 4 MG disintegrating tablet Commonly known as:  ZOFRAN ODT Take  1 tablet (4 mg total) by mouth every 8 (eight) hours as needed for nausea or vomiting.   oxyCODONE 15 MG immediate release tablet Commonly known as:  ROXICODONE Take 1 tablet (15 mg total) by mouth every 4 (four) hours as needed for severe pain. What changed:  medication strength  how much to take  when to take this  reasons to take this   SYMBICORT 160-4.5 MCG/ACT inhaler Generic drug:  budesonide-formoterol INHALE 2 PUFFS BY MOUTH FIRST THING IN THE MORNING AND THEN ANOTHER 2 PUFFS ABOUT 12 HOURS LATER       Diagnostic Studies: Dg Chest 2 View  Result Date: 06/06/2016 CLINICAL DATA:  Preoperative evaluation for nerve decompression. Tobacco use. EXAM: CHEST  2 VIEW  COMPARISON:  None. FINDINGS: There are scattered calcified granulomas bilaterally. No edema or consolidation. Heart size and pulmonary vascularity are normal. No adenopathy. There is mild degenerative change in the thoracic spine. IMPRESSION: Scattered calcified granulomas.  No edema or consolidation. Electronically Signed   By: Bretta BangWilliam  Woodruff III M.D.   On: 06/06/2016 09:11   Dg Lumbar Spine 1 View  Result Date: 06/15/2016 CLINICAL DATA:  Localization EXAM: LUMBAR SPINE - 1 VIEW COMPARISON:  None. FINDINGS: A lowest complete disc is labeled L5-S1. Advanced disc space narrowing at L4-5 and L5-S1. A needle projects over the posterior elements at the L4 pedicle level. An additional needle projects over the posterior elements at the S2 level. IMPRESSION: Intraoperative marking and the lumbar spine as described. Electronically Signed   By: Jolaine ClickArthur  Hoss M.D.   On: 06/15/2016 11:41   Dg Lumbar Spine 1 View  Result Date: 06/15/2016 CLINICAL DATA:  55 year old female with a history of L5-S1 decompression. EXAM: LUMBAR SPINE - 1 VIEW COMPARISON:  None. FINDINGS: Cross-table lateral of the lumbar spine demonstrates surgical curette at the facet joint of L4-L5, with the tip just posterior to the L4-L5 disc space. Multilevel degenerative changes. Grade 1 anterolisthesis of L4 on L5. Facet disease of lower lumbar levels, most pronounced at L4-L5 and L5-S1. IMPRESSION: Intraoperative cross-table lateral demonstrates surgical curette at the facet joint of L4-L5 with the tip posterior to the L4-L5 disc space. These results were called by telephone at the time of interpretation on 06/15/2016 at 9:34 am to Dr. Venita LickAHARI BROOKS , who verbally acknowledged these results. Signed, Yvone NeuJaime S. Loreta AveWagner, DO [facility] Electronically Signed   By: Gilmer MorJaime  Wagner D.O.   On: 06/15/2016 09:38    Disposition: 01-Home or Self Care Post op medication provided Pt will present to  clinic in 2 weeks   Follow-up Information    BROOKS,DAHARI D, MD Follow up.   Specialty:  Orthopedic Surgery Contact information: [address] [place] [address] [phone]            Signed: Kirt BoysMayo, Clarisse Rodriges Christina 06/22/2016, 7:29 AM

## 2016-06-23 ENCOUNTER — Emergency Department (HOSPITAL_COMMUNITY): Payer: Managed Care, Other (non HMO)

## 2016-06-23 ENCOUNTER — Emergency Department (HOSPITAL_COMMUNITY)
Admission: EM | Admit: 2016-06-23 | Discharge: 2016-06-23 | Disposition: A | Discharge status: Home / Self Care | Payer: Managed Care, Other (non HMO) | Attending: Emergency Medicine | Admitting: Emergency Medicine

## 2016-06-23 ENCOUNTER — Encounter (HOSPITAL_COMMUNITY): Payer: Self-pay | Admitting: Emergency Medicine

## 2016-06-23 DIAGNOSIS — G8918 Other acute postprocedural pain: Secondary | ICD-10-CM | POA: Insufficient documentation

## 2016-06-23 DIAGNOSIS — M545 Low back pain: Secondary | ICD-10-CM | POA: Insufficient documentation

## 2016-06-23 DIAGNOSIS — D72829 Elevated white blood cell count, unspecified: Secondary | ICD-10-CM | POA: Insufficient documentation

## 2016-06-23 DIAGNOSIS — Z79899 Other long term (current) drug therapy: Secondary | ICD-10-CM | POA: Diagnosis not present

## 2016-06-23 DIAGNOSIS — J449 Chronic obstructive pulmonary disease, unspecified: Secondary | ICD-10-CM | POA: Diagnosis not present

## 2016-06-23 DIAGNOSIS — F17200 Nicotine dependence, unspecified, uncomplicated: Secondary | ICD-10-CM | POA: Diagnosis not present

## 2016-06-23 LAB — CBC
HCT: 34.7 % — ABNORMAL LOW (ref 36.0–46.0)
HEMOGLOBIN: 11.8 g/dL — AB (ref 12.0–15.0)
MCH: 31.6 pg (ref 26.0–34.0)
MCHC: 34 g/dL (ref 30.0–36.0)
MCV: 93 fL (ref 78.0–100.0)
Platelets: 358 10*3/uL (ref 150–400)
RBC: 3.73 MIL/uL — AB (ref 3.87–5.11)
RDW: 11.8 % (ref 11.5–15.5)
WBC: 16 10*3/uL — AB (ref 4.0–10.5)

## 2016-06-23 LAB — URINALYSIS, ROUTINE W REFLEX MICROSCOPIC
Bilirubin Urine: NEGATIVE
GLUCOSE, UA: NEGATIVE mg/dL
HGB URINE DIPSTICK: NEGATIVE
KETONES UR: 5 mg/dL — AB
Leukocytes, UA: NEGATIVE
NITRITE: NEGATIVE
PH: 7 (ref 5.0–8.0)
PROTEIN: NEGATIVE mg/dL
Specific Gravity, Urine: 1.013 (ref 1.005–1.030)

## 2016-06-23 LAB — BASIC METABOLIC PANEL
Anion gap: 11 (ref 5–15)
BUN: 8 mg/dL (ref 6–20)
CHLORIDE: 101 mmol/L (ref 101–111)
CO2: 26 mmol/L (ref 22–32)
CREATININE: 0.7 mg/dL (ref 0.44–1.00)
Calcium: 9.4 mg/dL (ref 8.9–10.3)
GFR calc non Af Amer: >60 mL/min (ref >60)
Glucose, Bld: 98 mg/dL (ref 65–99)
POTASSIUM: 3.7 mmol/L (ref 3.5–5.1)
SODIUM: 138 mmol/L (ref 135–145)

## 2016-06-23 MED ORDER — OXYCODONE-ACETAMINOPHEN 5-325 MG PO TABS
2.0000 | ORAL_TABLET | Freq: Once | ORAL | Status: AC
  Administered 2016-06-23: 1 via ORAL
  Filled 2016-06-23: qty 2

## 2016-06-23 NOTE — ED Notes (Signed)
Pt walked with a normal gait. Stated her legs were sore but that was normal after her surgery.

## 2016-06-23 NOTE — ED Provider Notes (Signed)
[facility]-EMERGENCY DEPT Provider Note   CSN: [id number] Arrival date & time: 06/23/16  0719     History   Chief Complaint Chief Complaint  Patient presents with  . Back Pain    HPI Ann Richardson is a 55 y.o. female.  HPI  55 year old female status post back surgery (patient of Dr. Shon Baton) December 20 for spinal stenosis with laminectomy L4-L5 L5-S1. She has been home and has been doing well. She states that she has been taking her pain medicine. He has been taking narcotic pain medicine, as relaxants, and gabapentin. She states that yesterday she took her a.m. medications and then laid down in bed and did not wake up for several hours. She got up and took them again laid down and slept through the night. She states this is very unusual for her. She had been getting up and moving every hour. She states that in the past she has had infections when she has slept for extended periods of time. She denies any headache, URI symptoms, cough, nausea, vomiting, diarrhea, skin lesions, or urinary tract infection symptoms. She states that this morning when she woke up she could not move due to pain. She did not take her pain medication. She called her doctor's office who told her to call 911. Upon EMS arrival they suggest that she take her pain medication. She is currently feeling better. She denies that she has any weakness or tingling. She denies loss of bowel or bladder control or numbness.  Past Medical History:  Diagnosis Date  . Arthritis    spine- degenerative   . COPD (chronic obstructive pulmonary disease) (HCC)    CXR- 05/2016  . Hyperlipidemia   . 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 Pre-diabetes    pt. reports last A1C - 5/5, had been higher at one time     Patient Active Problem List   Diagnosis Date Noted  . Spinal stenosis, lumbar 06/15/2016  . COPD GOLD II/ still smoking  11/26/2014  . Cigarette smoker 11/26/2014    Past Surgical History:    Procedure Laterality Date  . CESAREAN SECTION  1983 and 1985  . DILATION AND CURETTAGE OF UTERUS     D&C  . LUMBAR LAMINECTOMY/DECOMPRESSION MICRODISCECTOMY N/A 06/15/2016   Procedure: L4-5, L5-S1 Decompression 2 LEVELS, InSitu FUSION L4-L5;  Surgeon: Venita Lick, MD;  Location: [facility] OR;  Service: Orthopedics;  Laterality: N/A;  . TONSILLECTOMY    . WISDOM TOOTH EXTRACTION     done in the hosp., done for impaction     OB History    No data available       Home Medications    Prior to Admission medications   Medication Sig Start Date End Date Taking? Authorizing Provider  albuterol (PROVENTIL HFA;VENTOLIN HFA) 108 (90 BASE) MCG/ACT inhaler Inhale 2 puffs into the lungs every 6 (six) hours as needed for wheezing or shortness of breath.    Historical Provider, MD  atorvastatin (LIPITOR) 10 MG tablet Take 5 mg by mouth daily at 6 PM.     Historical Provider, MD  cyclobenzaprine (FLEXERIL) 10 MG tablet Take 1 tablet by mouth 3 (three) times daily as needed.  05/23/16   Historical Provider, MD  gabapentin (NEURONTIN) 300 MG capsule Take 4 capsules by mouth 3 (three) times daily. 06/03/16   Historical Provider, MD  naproxen sodium (ANAPROX) 220 MG tablet Take 440 mg by mouth 2 (two) times daily with a meal.  Historical Provider, MD  ondansetron (ZOFRAN ODT) 4 MG disintegrating tablet Take 1 tablet (4 mg total) by mouth every 8 (eight) hours as needed for nausea or vomiting. 06/16/16   Baxter Kailarmen Christina Mayo, PA-C  oxyCODONE (ROXICODONE) 15 MG immediate release tablet Take 1 tablet (15 mg total) by mouth every 4 (four) hours as needed for severe pain. 06/16/16 06/26/16  Carmen Christina Mayo, PA-C  SYMBICORT 160-4.5 MCG/ACT inhaler INHALE 2 PUFFS BY MOUTH FIRST THING IN THE MORNING AND THEN ANOTHER 2 PUFFS ABOUT 12 HOURS LATER 03/04/16   Nyoka CowdenMichael B Wert, MD    Family History Family History  Problem Relation Age of Onset  . Emphysema Mother     smoked  . Heart disease Mother   . Breast  cancer Mother     with mets to bone  . Heart disease Father     Social History Social History  Substance Use Topics  . Smoking status: Current Every Day Smoker    Packs/day: 0.50    Years: 40.00  . Smokeless tobacco: Never Used  . Alcohol use 0.0 oz/week     Comment: social- one per month      Allergies   Patient has no known allergies.   Review of Systems Review of Systems  All other systems reviewed and are negative.    Physical Exam Updated Vital Signs BP 139/73 (BP Location: Right Arm)   Pulse 96   Temp 97.9 F (36.6 C) (Oral)   Resp 16   SpO2 96%   Physical Exam  Constitutional: She is oriented to person, place, and time. She appears well-developed and well-nourished.  HENT:  Head: Normocephalic and atraumatic.  Right Ear: External ear normal.  Left Ear: External ear normal.  Nose: Nose normal.  Mouth/Throat: Oropharynx is clear and moist.  Eyes: EOM are normal. Pupils are equal, round, and reactive to light.  Neck: Normal range of motion. Neck supple.  Cardiovascular: Normal rate and regular rhythm.   Pulmonary/Chest: Effort normal and breath sounds normal.  Abdominal: Soft. Bowel sounds are normal.  Musculoskeletal: Normal range of motion. She exhibits no edema or deformity.  Back exam with dressing in place. Upper part of dressing removed and incision appears intact without drainage. There is no erythema around the wound. It is palpated and is nontender and not warm.  Neurological: She is alert and oriented to person, place, and time.  Skin: Skin is warm and dry. Capillary refill takes less than 2 seconds. She is not diaphoretic.  Psychiatric: She has a normal mood and affect. Her behavior is normal. Judgment and thought content normal.  Nursing note and vitals reviewed.    ED Treatments / Results  Labs (all labs ordered are listed, but only abnormal results are displayed) Labs Reviewed  CBC - Abnormal; Notable for the following:       Result  Value   WBC 16.0 (*)    RBC 3.73 (*)    Hemoglobin 11.8 (*)    HCT 34.7 (*)    All other components within normal limits  URINALYSIS, ROUTINE W REFLEX MICROSCOPIC - Abnormal; Notable for the following:    Ketones, ur 5 (*)    All other components within normal limits  BASIC METABOLIC PANEL    EKG  EKG Interpretation None       Radiology Dg Chest 2 View  Result Date: 06/23/2016 CLINICAL DATA:  Patient with recent back surgery. Weakness and leukocytosis. EXAM: CHEST  2 VIEW COMPARISON:  Chest radiograph 06/06/2016.  FINDINGS: Normal cardiac and mediastinal contours. No consolidative pulmonary opacities. No pleural effusion or pneumothorax. Mid thoracic spine degenerative changes. IMPRESSION: No active cardiopulmonary disease. Electronically Signed   By: Annia Belt M.D.   On: 06/23/2016 09:52   Ct Lumbar Spine Wo Contrast  Result Date: 06/23/2016 CLINICAL DATA:  55 year old female status post lumbar surgery on 06/15/2016 with lumbar back and bilateral lower extremity pain. Initial encounter. EXAM: CT LUMBAR SPINE WITHOUT CONTRAST TECHNIQUE: Multidetector CT imaging of the lumbar spine was performed without intravenous contrast administration. Multiplanar CT image reconstructions were also generated. COMPARISON:  Intraoperative lumbar images 12 2017. FINDINGS: Segmentation: Normal. Alignment: Mild retrolisthesis of L5 on S1 and grade 1 anterolisthesis of L4 on L5 (5-6 mm) appears stable since the intraoperative images. Normal vertebral height and alignment elsewhere. Vertebrae: Postoperative changes to the L4 and L5 posterior elements described below. Visible sacrum and SI joints are intact. No other acute osseous abnormality identified. Paraspinal and other soft tissues: Aortoiliac calcified atherosclerosis noted. Otherwise negative visualized noncontrast abdominal and pelvic viscera. Postoperative changes to the posterior paraspinal soft tissues at the L4 and L5 levels. No discrete  postoperative fluid collection. There is overlying subcutaneous edema, as well as occasional punctate soft tissue gas which appears within normal limits for this point. See additional details below. Disc levels: There is a degree of congenital lumbar spinal canal narrowing (due to short pedicles) from the L2 to the L5 level. The following superimposed degenerative changes are noted: T11-T12: Moderate right facet hypertrophy. T12-L1: Negative. Incidental left L1 unfused transverse process ossification center. L1-L2: Minimal to mild disc bulge. Mild facet hypertrophy greater on the right. No significant stenosis. L2-L3: Circumferential disc bulge and moderate to severe facet hypertrophy greater on the right. Trace vacuum facet. Overall mild spinal stenosis and mild to moderate bilateral foraminal stenosis. L3-L4: Circumferential disc bulge with broad-based posterior component. Mild to moderate facet hypertrophy. Overall mild spinal stenosis and mild to moderate bilateral foraminal stenosis. L4-L5: Grade 1 anterolisthesis. Sequelae of decompressive laminectomy. Scattered posterior bone graft material. Bulky circumferential disc bulge with broad-based posterior component (series 4, image 72). Severe residual facet hypertrophy. The degree of residual spinal stenosis is unclear. Residual bilateral lateral recess stenosis is suspected. Disc material might affect the right L4 neural foramen to a greater extent, with up to moderate L4 foraminal stenosis. L5-S1: Sequelae of decompressive laminectomy. Bulky circumferential disc bulge which appears partially calcified. Bulky right eccentric broad-based posterior component (series 9, image 94). Moderate residual facet hypertrophy. Judging from the posterior bone graft material, severe residual spinal stenosis is suspected (same image). At least moderate bilateral neural foraminal stenosis is suspected, probably worse on the right. IMPRESSION: 1. Underlying congenital lumbar  spinal stenosis with bulky lower lumbar disc protrusions. Status post decompressive laminectomy at L4-L5 and L5-S1 with stable mild spondylolisthesis at each level. 2. Suspect severe residual spinal stenosis at L5-S1. See series 9, image 94. The degree of spinal canal patency at L4-L5 is unclear. Up to moderate bilateral neural foraminal stenosis at both levels, probably greater on the right. 3. Combined congenital and acquired spinal stenosis at L2-L3 and L3-L4 appears to be mild overall. Superimposed facet degeneration, severe on the right at L2-L3. Electronically Signed   By: Odessa Fleming M.D.   On: 06/23/2016 09:50    Procedures Procedures (including critical care time)  Medications Ordered in ED Medications  oxyCODONE-acetaminophen (PERCOCET/ROXICET) 5-325 MG per tablet 2 tablet (1 tablet Oral Given 06/23/16 0818)     Initial Impression /  Assessment and Plan / ED Course  I have reviewed the triage vital signs and the nursing notes.  Pertinent labs & imaging results that were available during my care of the patient were reviewed by me and considered in my medical decision making (see chart for details).  Clinical Course   patient appears stable here. She is ambulating without difficulty. Lab work revealed leukocytosis. Patient with normal urine, no signs of infection on physical exam, and chest x-Ronette Hank without evidence of pneumonia. I discussed the patient's care with Dr. Aundria Rudogers, on call for Dr. Shon BatonBrooks. Leukocytosis possibly secondary to recent surgery. Patient has continued well here in the emergency department. Her pain control has improved since she has taken oral pain medicine. She is able to walk without difficulty. Dr. Shon BatonBrooks wishes to see her in follow-up tomorrow. I communicated this to the patient. She understands the need for follow-up and return precautions and voices understanding.  Evaluated here in emergency department.  Final Clinical Impressions(s) / ED Diagnoses   Final  diagnoses:  Post-op pain  Leukocytosis, unspecified type    New Prescriptions New Prescriptions   No medications on file     Margarita Grizzleanielle Alex Mcmanigal, MD 06/23/16 1354

## 2016-06-23 NOTE — ED Notes (Signed)
Patient transported to CT 

## 2016-06-23 NOTE — Discharge Instructions (Signed)
Take medicines at home as prescribed. Return if you are worse, especially fever or chills. Follow-up with Dr. Shon BatonBrooks tomorrow.

## 2016-06-23 NOTE — ED Notes (Signed)
Pt expresses concern about more oxycodone when she states she took 15 mg at 0600. Same reported to Dr. Hassie Bruceey and order modified to 1 tablet.

## 2016-06-23 NOTE — ED Triage Notes (Signed)
Pt here from home with c/o weakness, pt had a nerve decompression on the 20th that Dr brooks performed , pt stated that she was unable to roll over this morning or get up , no bowel or bladder issues , pt able to roll in bed this am and move legs

## 2017-03-19 IMAGING — CT CT L SPINE W/O CM
3 of 4 series · 9 of 33 positions shown, 11 images · non-contrast
Comparison: Intraoperative lumbar images 12 [id number].

CLINICAL DATA: 55-year-old female status post lumbar surgery on
06/15/2016 with lumbar back and bilateral lower extremity pain.
Initial encounter.

EXAM:
CT LUMBAR SPINE WITHOUT CONTRAST
TECHNIQUE: Multidetector CT imaging of the lumbar spine was performed without
intravenous contrast administration. Multiplanar CT image
reconstructions were also generated.

[Series 4: l-spine 2.0 st · axial · 0.25mm/px · z∈[-226,-226]mm · 1 of 123 slices shown, 2 images]
[im 62/123  soft-tissue]
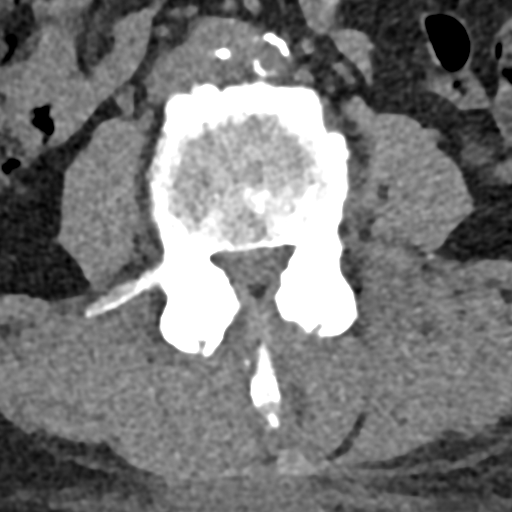
[im 62/123  bone]
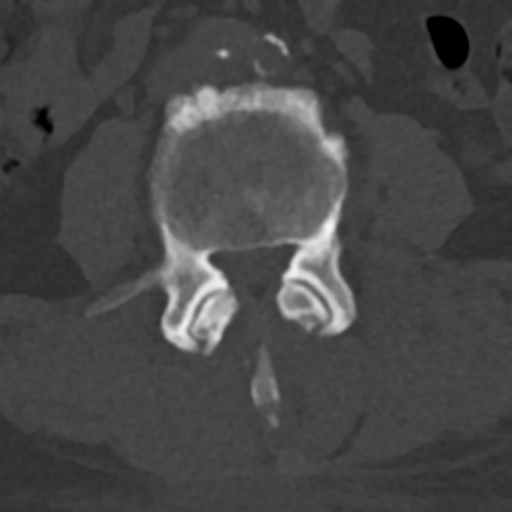

[Series 6: l-spine 2.0 cor bone · coronal · 0.26mm/px · 3 of 71 slices shown]
[im 15/71  bone]
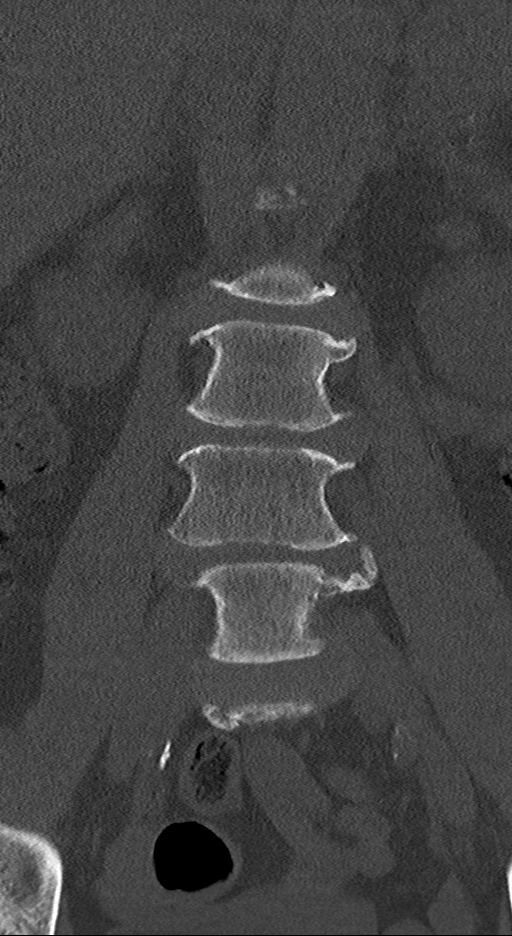
[im 29/71  bone]
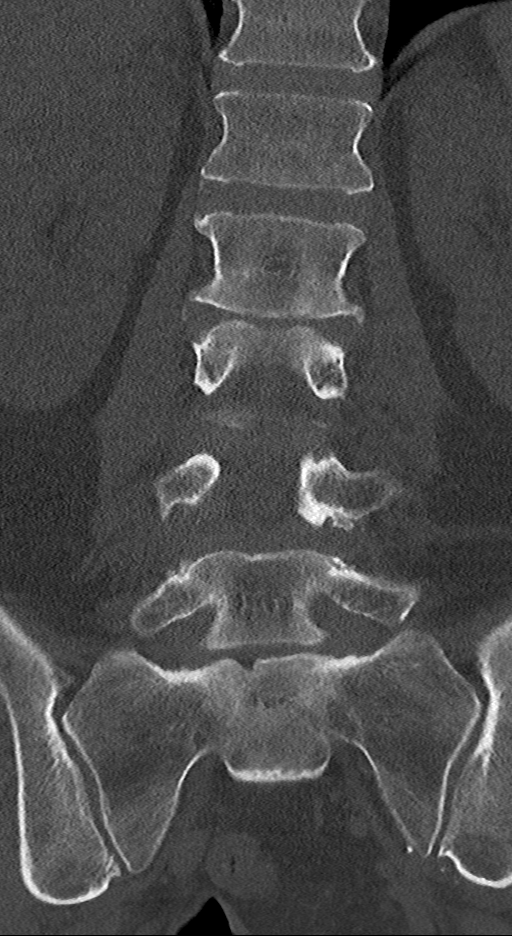
[im 43/71  bone]
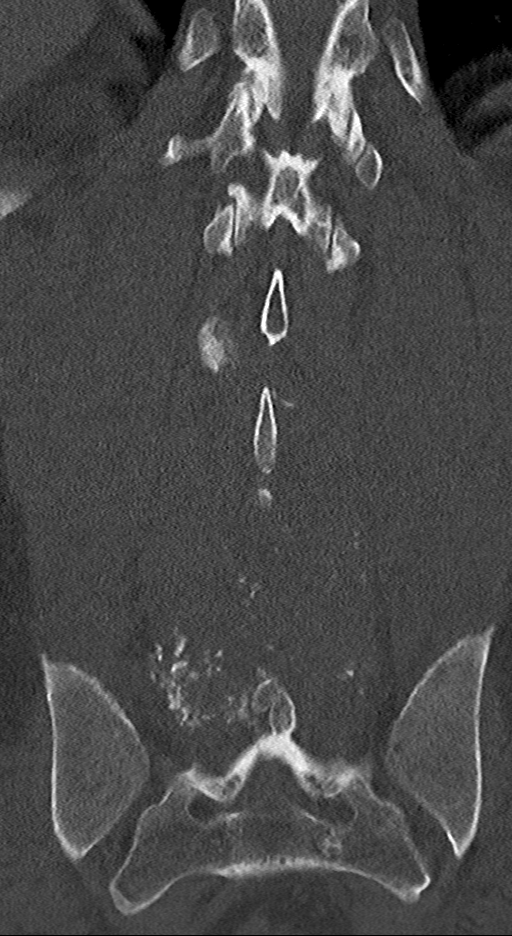

[Series 7: l-spine 2.0 sag bone · sagittal · 0.29mm/px · 5 of 61 slices shown, 6 images]
[im 21/61  bone]
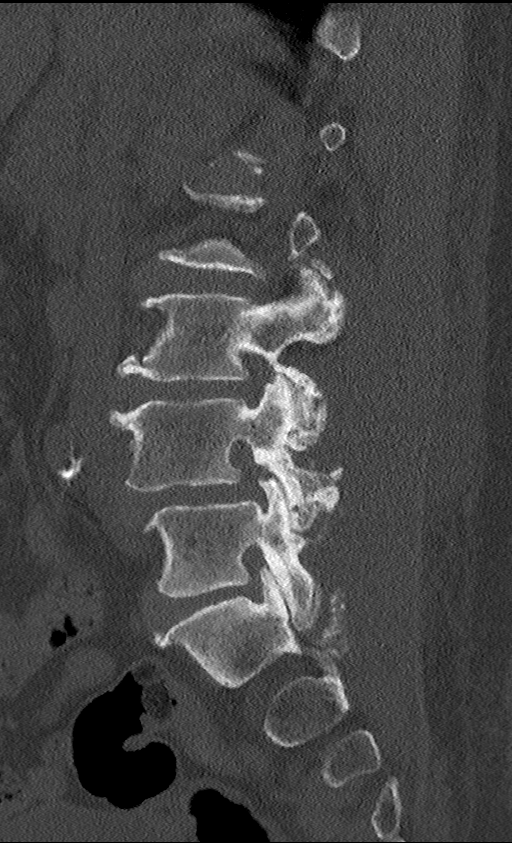
[im 26/61  bone]
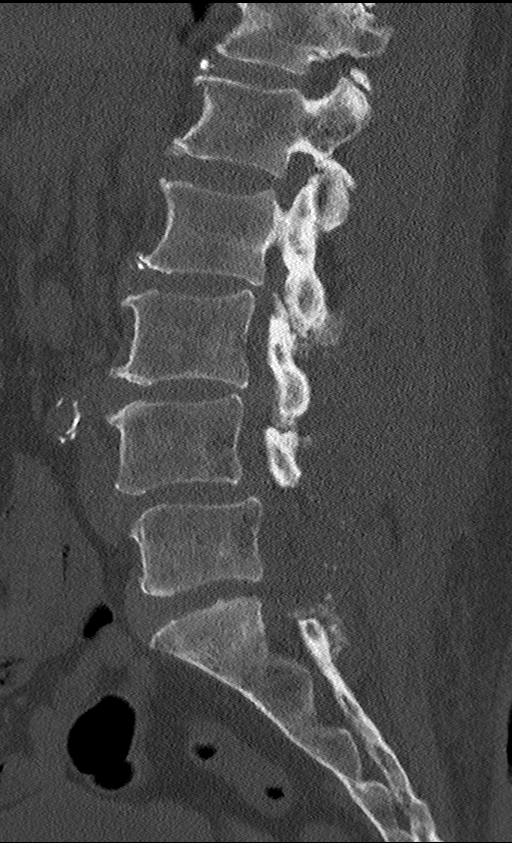
[im 31/61  soft-tissue]
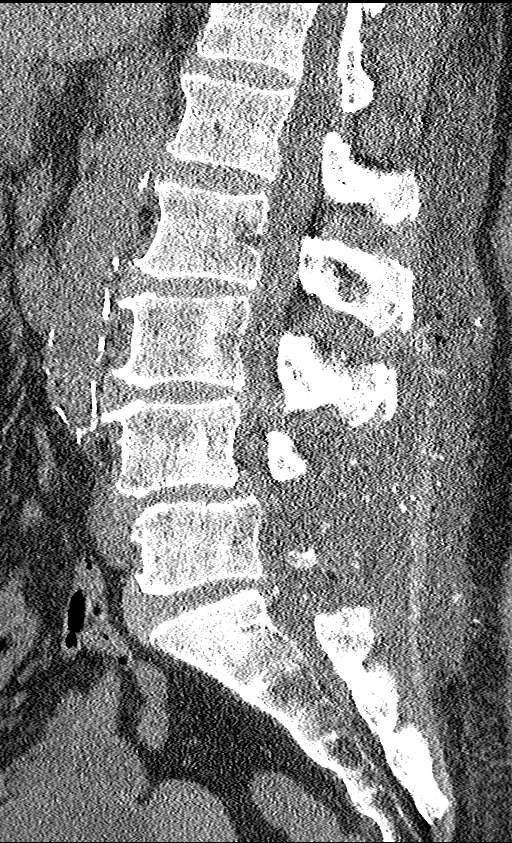
[im 31/61  bone]
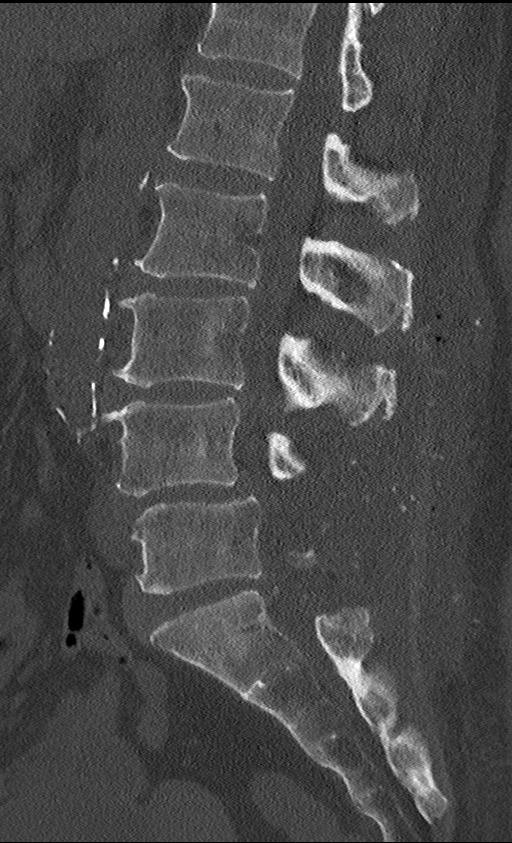
[im 36/61  bone]
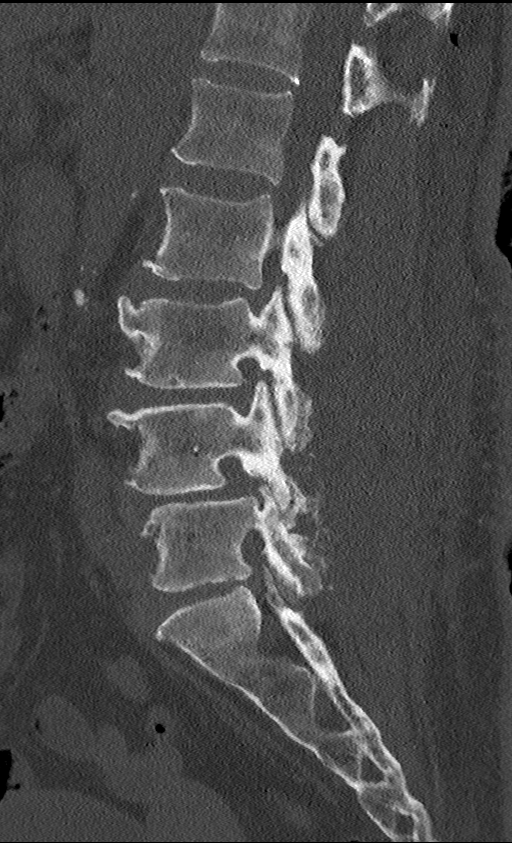
[im 41/61  bone]
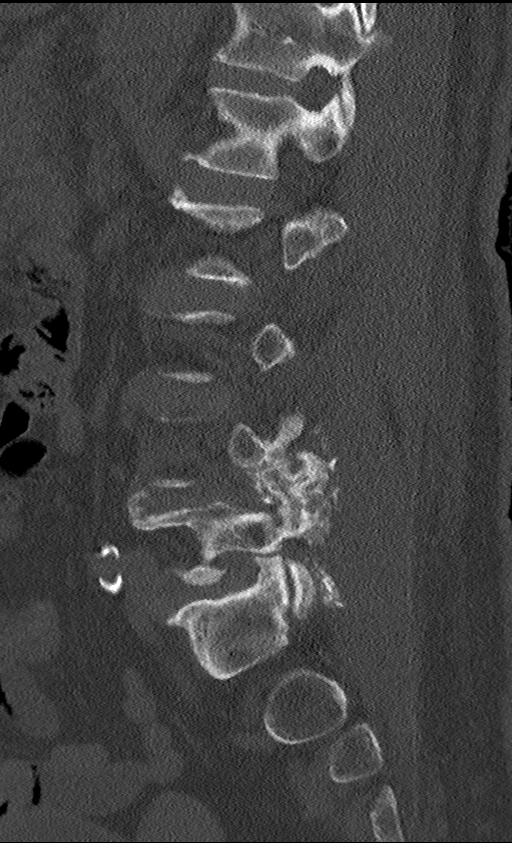

[9 of 33 positions shown; findings below may reference images not displayed]

FINDINGS: Segmentation: Normal.

Alignment: Mild retrolisthesis of L5 on S1 and grade 1
anterolisthesis of L4 on L5 (5-6 mm) appears stable since the
intraoperative images. Normal vertebral height and alignment
elsewhere.

Vertebrae: Postoperative changes to the L4 and L5 posterior elements
described below. Visible sacrum and SI joints are intact. No other
acute osseous abnormality identified.

Paraspinal and other soft tissues: Aortoiliac calcified
atherosclerosis noted. Otherwise negative visualized noncontrast
abdominal and pelvic viscera.

Postoperative changes to the posterior paraspinal soft tissues at
the L4 and L5 levels. No discrete postoperative fluid collection.
There is overlying subcutaneous edema, as well as occasional
punctate soft tissue gas which appears within normal limits for this
point. See additional details below.

Disc levels:

There is a degree of congenital lumbar spinal canal narrowing (due
to short pedicles) from the L2 to the L5 level. The following
superimposed degenerative changes are noted:

T11-T12: Moderate right facet hypertrophy.

T12-L1: Negative. Incidental left L1 unfused transverse process
ossification center.

L1-L2: Minimal to mild disc bulge. Mild facet hypertrophy greater on
the right. No significant stenosis.

L2-L3: Circumferential disc bulge and moderate to severe facet
hypertrophy greater on the right. Trace vacuum facet. Overall mild
spinal stenosis and mild to moderate bilateral foraminal stenosis.

L3-L4: Circumferential disc bulge with broad-based posterior
component. Mild to moderate facet hypertrophy. Overall mild spinal
stenosis and mild to moderate bilateral foraminal stenosis.

L4-L5: Grade 1 anterolisthesis. Sequelae of decompressive
laminectomy. Scattered posterior bone graft material. Bulky
circumferential disc bulge with broad-based posterior component
(series 4, image 72). Severe residual facet hypertrophy. The degree
of residual spinal stenosis is unclear. Residual bilateral lateral
recess stenosis is suspected. Disc material might affect the right
L4 neural foramen to a greater extent, with up to moderate L4
foraminal stenosis.

L5-S1: Sequelae of decompressive laminectomy. Bulky circumferential
disc bulge which appears partially calcified. Bulky right eccentric
broad-based posterior component (series 9, image 94). Moderate
residual facet hypertrophy. Judging from the posterior bone graft
material, severe residual spinal stenosis is suspected (same image).
At least moderate bilateral neural foraminal stenosis is suspected,
probably worse on the right.
IMPRESSION: 1. Underlying congenital lumbar spinal stenosis with bulky lower
lumbar disc protrusions. Status post decompressive laminectomy at
L4-L5 and L5-S1 with stable mild spondylolisthesis at each level.
2. Suspect severe residual spinal stenosis at L5-S1. See series 9,
image 94. The degree of spinal canal patency at L4-L5 is unclear. Up
to moderate bilateral neural foraminal stenosis at both levels,
probably greater on the right.
3. Combined congenital and acquired spinal stenosis at L2-L3 and
L3-L4 appears to be mild overall. Superimposed facet degeneration,
severe on the right at L2-L3.

## 2017-09-12 ENCOUNTER — Ambulatory Visit
Admission: RE | Admit: 2017-09-12 | Discharge: 2017-09-12 | Disposition: A | Discharge status: Home / Self Care | Payer: 59 | Source: Ambulatory Visit | Attending: Physician Assistant | Admitting: Physician Assistant

## 2017-09-12 ENCOUNTER — Other Ambulatory Visit: Payer: Self-pay | Admitting: Physician Assistant

## 2017-09-12 DIAGNOSIS — R0602 Shortness of breath: Secondary | ICD-10-CM

## 2017-09-22 ENCOUNTER — Encounter: Payer: Self-pay | Admitting: Internal Medicine

## 2017-09-22 ENCOUNTER — Ambulatory Visit (INDEPENDENT_AMBULATORY_CARE_PROVIDER_SITE_OTHER): Payer: 59 | Admitting: Internal Medicine

## 2017-09-22 VITALS — BP 120/70 | HR 95 | Ht 60.0 in | Wt 127.0 lb

## 2017-09-22 DIAGNOSIS — F17210 Nicotine dependence, cigarettes, uncomplicated: Secondary | ICD-10-CM | POA: Diagnosis not present

## 2017-09-22 DIAGNOSIS — J449 Chronic obstructive pulmonary disease, unspecified: Secondary | ICD-10-CM | POA: Diagnosis not present

## 2017-09-22 NOTE — Patient Instructions (Addendum)
Plan A = Automatic = Breo one click each am   Work on inhaler technique:  relax and gently blow all the way out then take a nice smooth deep breath back in, triggering the inhaler at same time you start breathing in.  Hold for up to 5 seconds if you can. Blow out thru nose. Rinse and gargle with water when done    Plan B = Backup Only use your albuterol as a rescue medication to be used if you can't catch your breath by resting or doing a relaxed purse lip breathing pattern.  - The less you use it, the better it will work when you need it. - Ok to use the inhaler up to 2 puffs  every 4 hours if you must but call for appointment if use goes up over your usual need - Don't leave home without it !!  (think of it like the spare tire for your car)   Plan C = Crisis - only use your albuterol nebulizer if you first try Plan B and it fails to help > ok to use the nebulizer up to every 4 hours but if start needing it regularly call for immediate appointment   Plan D = Doctor - call me if B and C not adequate  Plan E = ER - go to ER or call 911 if all else fails     Try prilosec otc 20mg   Take 30-60 min before first meal of the day and Pepcid ac (famotidine) 20 mg one @  bedtime until cough is completely gone for at least a week without the need for cough suppression  GERD (REFLUX)  is an extremely common cause of respiratory symptoms just like yours , many times with no obvious heartburn at all.    It can be treated with medication, but also with lifestyle changes including elevation of the head of your bed (ideally with 6 inch  bed blocks),  Smoking cessation, avoidance of late meals, excessive alcohol, and avoid fatty foods, chocolate, peppermint, colas, red wine, and acidic juices such as orange juice.  NO MINT OR MENTHOL PRODUCTS SO NO COUGH DROPS   USE SUGARLESS CANDY INSTEAD (Jolley ranchers or Stover's or Life Savers) or even ice chips will also do - the key is to swallow to prevent all  throat clearing. NO OIL BASED VITAMINS - use powdered substitutes.    Please schedule a follow up office visit in 4 weeks, sooner if needed with PFTs as needed

## 2017-09-22 NOTE — Progress Notes (Signed)
pft  

## 2017-09-22 NOTE — Progress Notes (Signed)
Subjective:     Patient ID: Ann Richardson, female   DOB: 1960/12/06,   MRN: [id number]    Brief patient profile:  20 yowf active smoker never intense aerobic but able to do sports/ soft ball and did fine until around 2010 with doe with yardwork with assoc cough in addition has in frequency frequency episodes of bronchitis rx abx/ prednisone better then tried advair/ singulair no better so referred pulmonary clinic 11/26/2014 by  Cooper Render NP with dx GOLD II copd/ MM phenotype  01/08/15    History of Present Illness  11/26/2014 1st [facility] office visit/ Audriella Blakeley   Chief Complaint  Patient presents with  . Pulmonary Consult    Referred by Cooper Render, NP. Pt states she has been dxed with Bronchitis x 3 so far in 2016. She c/o cough and SOB. She gets SOB with walking up stairs. Her cough is non prod at this time. She is using albuterol inhaler 2-3 x per wk on average.   no real change symptoms on advair but hfa poor  Cough quite a bit worse in am but not productive p last round of prednisone and her goal is to prevent future flares  rec Stop advair and singulair  Plan A = automatic = symbicort 80 Take 2 puffs first thing in am and then another 2 puffs about 12 hours later.  Plan B = backup - Only use your albuterol as a rescue medication   01/08/2015 f/u ov/Shajuana Mclucas re: copd  GOLD II/ confused with meds/ names maint vs prns  - thinks she's still on advair  Chief Complaint  Patient presents with  . office visit    pft done, inhalers are helping, breathing not bad asit was going up stairs & 1st thing in the AM   poor mdi but doing better / cough some better but still some  Min white mucus production  rec You have GOLD II severity-  moderate and will continue to progress as long as you smoke - the key is to stop smoking now  Continue symbicort  160  Take 2 puffs first thing in am and then another 2 puffs about 12 hours later.  Work on inhaler technique:   Please schedule a follow up  visit in 3 months but call sooner if needed     04/10/2015  f/u ov/Arath Kaigler re: COPD II on symbicort 160 2bid  Chief Complaint  Patient presents with  . Follow-up    Pt states doing well. She has not needed albuterol. No new co's today.  Not limited by breathing from desired activities   / occ am cough but no excess / purulent sputum rec chantix starter pack - we can call you in 2 more months if you haven't established with primary by then    06/06/2016  Extended f/u ov/Yanky Vanderburg consult re: Preo op pulmonary eval for L4 surguery: COPD II / symbicort 2 bid and no need for saba Chief Complaint  Patient presents with  . Follow-up    Needing pulmonary clearance for nerve decompression surgery. She states her breathing is doing well and can not recall the last time that she needed albuterol.   sleeps ok resp wise / am cough occ one half hour > thin mucus/ not presently  Limited by L leg and foot pain/weakness not by breathing  rec Strongly rec you start the chantix and stop the smoking You are cleared for surgery as long as no flare in meantime -   Work  on perfecting inhaler technique:     09/22/2017 acute extended ov/Jacque Byron re:  COPD II/ still smoking / no longer gabapentin  Chief Complaint  Patient presents with  . Acute Visit    Pateint feels like she has bronchitis, cough some production clear in color. For about a month and ahalf now.  until Feb 2019 was doing ok on symb 160 2 bid s rescue  Felt came down with cold 08/17/17 (one day off jet)  with R ear pain/ sob > [place] while visiting son dx as ear infection > rx amox > returned next day rx "didn't get enough steroids "  > returned to [place] > pcp zpak and more prednisone, codeine cough syrup > back to PCP > rx more prednisone/ ok cxr > more codeine and change to BREO main issue is cough 24/7 more bothersome at hs and sob x 50 ft / one flight steps and stops at top   Albuterol saba helps a little and neb a bit more (last used 3 h prior to  OV )    No obvious day to day or daytime variability or assoc true excess/ purulent sputum or mucus plugs or hemoptysis or cp or chest tightness, subjective wheeze or overt sinus or hb symptoms. No unusual exposure hx or h/o childhood pna/ asthma or knowledge of premature birth.    Also denies any obvious fluctuation of symptoms with weather or environmental changes or other aggravating or alleviating factors except as outlined above   Current Allergies, Complete Past Medical History, Past Surgical History, Family History, and Social History were reviewed in [employer] record.  ROS  The following are not active complaints unless bolded Hoarseness, sore throat, dysphagia, dental problems, itching, sneezing,  nasal congestion or discharge of excess mucus or purulent secretions, ear ache,   fever, chills, sweats, unintended wt loss or wt gain, classically pleuritic or exertional cp,  orthopnea pnd or leg swelling, presyncope, palpitations, abdominal pain, anorexia, nausea, vomiting, diarrhea  or change in bowel habits or change in bladder habits, change in stools or change in urine, dysuria, hematuria,  rash, arthralgias, visual complaints, headache, numbness, weakness or ataxia or problems with walking or coordination,  change in mood/affect or memory.        Current Meds  Medication Sig  . albuterol (PROVENTIL HFA;VENTOLIN HFA) 108 (90 BASE) MCG/ACT inhaler Inhale 2 puffs into the lungs every 6 (six) hours as needed for wheezing or shortness of breath.  . ALPRAZolam (XANAX) 1 MG tablet   . atorvastatin (LIPITOR) 10 MG tablet Take 5 mg by mouth daily at 6 PM.   . BREO ELLIPTA 100-25 MCG/INH AEPB   . cyclobenzaprine (FLEXERIL) 10 MG tablet Take 1 tablet by mouth 3 (three) times daily as needed.   . gabapentin (NEURONTIN) 300 MG capsule Take 4 capsules by mouth 3 (three) times daily.  . [EXPIRED] HYDROcodone-homatropine (HYCODAN) 5-1.5 MG/5ML syrup Take by mouth.  . naproxen  sodium (ANAPROX) 220 MG tablet Take 440 mg by mouth 2 (two) times daily with a meal.  . ondansetron (ZOFRAN ODT) 4 MG disintegrating tablet Take 1 tablet (4 mg total) by mouth every 8 (eight) hours as needed for nausea or vomiting.                        Objective:   Physical Exam   amb wf smoker's rattle  09/22/2017       127   06/06/2016  130  04/10/15 141 lb 6.4 oz (64.139 kg)  01/08/15 140 lb (63.504 kg)  11/26/14 137 lb (62.143 kg)     Vital signs reviewed - Note on arrival 02 sats  90% on RA          HEENT: nl dentition / oropharynx. Nl external ear canals without cough reflex - moderate bilateral non-specific turbinate edema     NECK :  without JVD/Nodes/TM/ nl carotid upstrokes bilaterally   LUNGS: no acc muscle use,  Mod barrel  contour chest wall with bilateral  Distant bs s audible wheeze and  without cough on insp or exp maneuver and mod   Hyperresonant  to  percussion bilaterally     CV:  RRR  no s3 or murmur or increase in P2, and no edema   ABD:  soft and nontender with pos mid insp Hoover's  in the supine position. No bruits or organomegaly appreciated, bowel sounds nl  MS:  Nl gait  ext warm without deformities, calf tenderness, cyanosis or clubbing No obvious joint restrictions   SKIN: warm and dry without lesions    NEURO:  alert, approp, nl sensorium with  no motor or cerebellar deficits apparent.              I personally reviewed images and agree with radiology impression as follows:  CXR:   09/12/17 No active cardiopulmonary disease.     Assessment:

## 2017-09-23 ENCOUNTER — Encounter: Payer: Self-pay | Admitting: Internal Medicine

## 2017-09-23 NOTE — Assessment & Plan Note (Signed)

## 2017-09-23 NOTE — Assessment & Plan Note (Signed)
- 01/08/2015 pfts  FEV1  1.31 (54%) s am meds and ratio 56% p 9% improvement form saba with dlco 57 to 73% p correct for alv vol > rec symb 160 2bid  - alpha one  01/08/2015 > MM  - 09/22/2017  After extensive coaching inhaler device  effectiveness =    75% with hfa/ 90% with dpi   Symptoms of cough and dyspnea poorly controlled since uri. DDX of  difficult airways management almost all start with A and  include Adherence, Ace Inhibitors, Acid Reflux, Active Sinus Disease, Alpha 1 Antitripsin deficiency, Anxiety masquerading as Airways dz,  ABPA,  Allergy(esp in young), Aspiration (esp in elderly), Adverse effects of meds,  Active smokers, A bunch of PE's (a small clot burden can't cause this syndrome unless there is already severe underlying pulm or vascular dz with poor reserve) plus two Bs  = Bronchiectasis and Beta blocker use..and one C= CHF   Adherence is always the initial "prime suspect" and is a multilayered concern that requires a "trust but verify" approach in every patient - starting with knowing how to use medications, especially inhalers, correctly, keeping up with refills and understanding the fundamental difference between maintenance and prns vs those medications only taken for a very short course and then stopped and not refilled.  - see hfa/dpi teaching above - return with all meds in hand using a trust but verify approach to confirm accurate Medication  Reconciliation The principal here is that until we are certain that the  patients are doing what we've asked, it makes no sense to ask them to do more.    Active smoking at top of the usual list of other suspects (see separate a/p)  ? Acid (or non-acid) GERD > always difficult to exclude as up to 75% of pts in some series report no assoc GI/ Heartburn symptoms> rec max (24h)  acid suppression and diet restrictions/ reviewed and instructions given in writing.  - Of the three most common causes of  Sub-acute or recurrent or chronic  cough, only one (GERD)  can actually contribute to/ trigger  the other two (asthma and post nasal drip syndrome)  and perpetuate the cylce of cough.  While not intuitively obvious, many patients with chronic low grade reflux do not cough until there is a primary insult that disturbs the protective epithelial barrier and exposes sensitive nerve endings.   This is typically viral but can be direct physical injury such as with an endotracheal tube.   The point is that once this occurs, it is difficult to eliminate the cycle  using anything but a maximally effective acid suppression regimen at least in the short run, accompanied by an appropriate diet to address non acid GERD     ? Adverse effects of DPI > she's convinced breo working better than symb (likely related to suboptimal hfa and more effective dpi technique but does carry the risk the dpi will start contributing to upper airway cough different than what she demonstrated today > for now continue breo.   ? Active sinus dz > all started with "each ache" with nl exam today but well could have been max sinusitis > low threshold to perform sinus CT if persists rather than additional abx which risk side effects/ resistance   I had an extended discussion with the patient reviewing all relevant studies completed to date and  lasting 15 to 20 minutes of a 25 minute acute office visit    Each maintenance medication  was reviewed in detail including most importantly the difference between maintenance and prns and under what circumstances the prns are to be triggered using an action plan format that is not reflected in the computer generated alphabetically organized AVS.    Please see AVS for specific instructions unique to this visit that I personally wrote and verbalized to the the pt in detail and then reviewed with pt  by my nurse highlighting any  changes in therapy recommended at today's visit to their plan of care.     ? Anxiety/depression >  usually at the bottom of this list of usual suspects but should be much higher on this pt's based on H and P and note already on psychotropics and may interfere with efforts to quit smoking.

## 2017-10-05 ENCOUNTER — Ambulatory Visit: Payer: 59 | Admitting: Internal Medicine

## 2017-10-30 ENCOUNTER — Ambulatory Visit (INDEPENDENT_AMBULATORY_CARE_PROVIDER_SITE_OTHER): Payer: 59 | Admitting: Internal Medicine

## 2017-10-30 ENCOUNTER — Encounter: Payer: Self-pay | Admitting: Internal Medicine

## 2017-10-30 VITALS — BP 112/72 | HR 88 | Ht 61.0 in | Wt 127.0 lb

## 2017-10-30 DIAGNOSIS — F17210 Nicotine dependence, cigarettes, uncomplicated: Secondary | ICD-10-CM

## 2017-10-30 DIAGNOSIS — J449 Chronic obstructive pulmonary disease, unspecified: Secondary | ICD-10-CM | POA: Diagnosis not present

## 2017-10-30 LAB — PULMONARY FUNCTION TEST
DL/VA % pred: 52 %
DL/VA: 2.33 ml/min/mmHg/L
DLCO UNC: 8.07 ml/min/mmHg
DLCO unc % pred: 39 %
FEF 25-75 Post: 0.46 L/sec
FEF 25-75 Pre: 0.5 L/sec
FEF2575-%CHANGE-POST: -7 %
FEF2575-%Pred-Post: 20 %
FEF2575-%Pred-Pre: 22 %
FEV1-%CHANGE-POST: -2 %
FEV1-%PRED-POST: 43 %
FEV1-%Pred-Pre: 44 %
FEV1-PRE: 1.03 L
FEV1-Post: 1 L
FEV1FVC-%CHANGE-POST: -4 %
FEV1FVC-%Pred-Pre: 68 %
FEV6-%Change-Post: 0 %
FEV6-%PRED-PRE: 66 %
FEV6-%Pred-Post: 66 %
FEV6-PRE: 1.92 L
FEV6-Post: 1.93 L
FEV6FVC-%Change-Post: -1 %
FEV6FVC-%PRED-PRE: 102 %
FEV6FVC-%Pred-Post: 100 %
FVC-%Change-Post: 2 %
FVC-%PRED-PRE: 64 %
FVC-%Pred-Post: 66 %
FVC-POST: 1.98 L
FVC-PRE: 1.94 L
POST FEV6/FVC RATIO: 98 %
PRE FEV6/FVC RATIO: 99 %
Post FEV1/FVC ratio: 51 %
Pre FEV1/FVC ratio: 53 %

## 2017-10-30 MED ORDER — GLYCOPYRROLATE-FORMOTEROL 9-4.8 MCG/ACT IN AERO: 2.0000 | INHALATION_SPRAY | Freq: Two times a day (BID) | RESPIRATORY_TRACT | 11 refills | Status: DC

## 2017-10-30 MED ORDER — PANTOPRAZOLE SODIUM 40 MG PO TBEC: 40.0000 mg | DELAYED_RELEASE_TABLET | Freq: Every day | ORAL | 2 refills | Status: DC

## 2017-10-30 MED ORDER — FAMOTIDINE 20 MG PO TABS: ORAL_TABLET | ORAL | 11 refills | Status: DC

## 2017-10-30 NOTE — Progress Notes (Signed)
PFT completed today.  

## 2017-10-30 NOTE — Patient Instructions (Addendum)
Pantoprazole (protonix) 40 mg   Take  30-60 min before first meal of the day and Pepcid (famotidine)  20 mg one @  bedtime until return to office - this is the best way to tell whether stomach acid is contributing to your problem.    Change symbicort to  Bevespi Take 2 puffs first thing in am and then another 2 puffs about 12 hours later.   Work on inhaler technique:  relax and gently blow all the way out then take a nice smooth deep breath back in, triggering the inhaler at same time you start breathing in.  Hold for up to 5 seconds if you can. Blow out thru nose. Rinse and gargle with water when done   The key is to stop smoking completely before smoking completely stops you!    Please schedule a follow up visit in 3 months but call sooner if needed

## 2017-10-30 NOTE — Progress Notes (Signed)
Subjective:     Patient ID: Ann Richardson, female   DOB: Nov 14, 1960,   MRN: [id number]    Brief patient profile:  23 yowf active smoker never intense aerobic but able to do sports/ soft ball and did fine until around 2010 with doe with yardwork with assoc cough in addition has in frequency frequency episodes of bronchitis rx abx/ prednisone better then tried advair/ singulair no better so referred pulmonary clinic 11/26/2014 by  Ann Render NP with dx GOLD II copd/ MM phenotype  01/08/15  With GOLD III criteria 10/30/2017 assoc with ongoing smoking   History of Present Illness  11/26/2014 1st  Pulmonary office visit/ Ann Richardson   Chief Complaint  Patient presents with  . Pulmonary Consult    Referred by Ann Render, NP. Pt states she has been dxed with Bronchitis x 3 so far in 2016. She c/o cough and SOB. She gets SOB with walking up stairs. Her cough is non prod at this time. She is using albuterol inhaler 2-3 x per wk on average.   no real change symptoms on advair but hfa poor  Cough quite a bit worse in am but not productive p last round of prednisone and her goal is to prevent future flares  rec Stop advair and singulair  Plan A = automatic = symbicort 80 Take 2 puffs first thing in am and then another 2 puffs about 12 hours later.  Plan B = backup - Only use your albuterol as a rescue medication   01/08/2015 f/u ov/Ann Richardson re: copd  GOLD II/ confused with meds/ names maint vs prns  - thinks she's still on advair  Chief Complaint  Patient presents with  . office visit    pft done, inhalers are helping, breathing not bad asit was going up stairs & 1st thing in the AM   poor mdi but doing better / cough some better but still some  Min white mucus production  rec You have GOLD II severity-  moderate and will continue to progress as long as you smoke - the key is to stop smoking now  Continue symbicort  160  Take 2 puffs first thing in am and then another 2 puffs about 12 hours later.   Work on inhaler technique:       10/30/2017  f/u ov/Ann Richardson re:  GOLD III copd now on symb 160 2bid Chief Complaint  Patient presents with  . Follow-up    Feeling better ,PFT today.  Dyspnea:  Limited by L leg in splint Cough: some first thing in am > mucoid Sleep: 1 pillows  SABA use: none since started on acid suppression     No obvious day to day or daytime variability or assoc excess/ purulent sputum or mucus plugs or hemoptysis or cp or chest tightness, subjective wheeze or overt sinus or hb symptoms. No unusual exposure hx or h/o childhood pna/ asthma or knowledge of premature birth.  Sleeping  1 pillow ok  without nocturnal   exacerbation  of respiratory  c/o's or need for noct saba. Also denies any obvious fluctuation of symptoms with weather or environmental changes or other aggravating or alleviating factors except as outlined above   Current Allergies, Complete Past Medical History, Past Surgical History, Family History, and Social History were reviewed in [employer] record.  ROS  The following are not active complaints unless bolded Hoarseness, sore throat, dysphagia, dental problems, itching, sneezing,  nasal congestion or discharge of excess mucus or purulent  secretions, ear ache,   fever, chills, sweats, unintended wt loss or wt gain, classically pleuritic or exertional cp,  orthopnea pnd or arm/hand swelling  or leg swelling, presyncope, palpitations, abdominal pain, anorexia, nausea, vomiting, diarrhea  or change in bowel habits or change in bladder habits, change in stools or change in urine, dysuria, hematuria,  rash, arthralgias, visual complaints, headache, numbness, weakness or ataxia or problems with walking or coordination,  change in mood or  memory.        Current Meds  Medication Sig  . albuterol (PROVENTIL HFA;VENTOLIN HFA) 108 (90 BASE) MCG/ACT inhaler Inhale 2 puffs into the lungs every 6 (six) hours as needed for wheezing or shortness of  breath.  . ALPRAZolam (XANAX) 1 MG tablet Take 1 mg by mouth at bedtime as needed.   Marland Kitchen atorvastatin (LIPITOR) 10 MG tablet Take 5 mg by mouth daily at 6 PM.   . gabapentin (NEURONTIN) 300 MG capsule Take 300 mg by mouth 3 (three) times daily.  . Oxycodone HCl 10 MG TABS Take 1 tablet by mouth 3 (three) times daily as needed.  .   SYMBICORT 160-4.5 MCG/ACT inhaler INHALE 2 PUFFS BY MOUTH FIRST THING IN THE MORNING AND THEN ANOTHER 2 PUFFS ABOUT 12 HOURS LATER                        Objective:   Physical Exam   amb wf still with moderate smoker's rattle on voluntary cough maneuver   10/30/2017         127  09/22/2017       127   06/06/2016    130  04/10/15 141 lb 6.4 oz (64.139 kg)  01/08/15 140 lb (63.504 kg)  11/26/14 137 lb (62.143 kg)     Vital signs reviewed - Note on arrival 02 sats  91% on ra         HEENT: nl dentition / oropharynx. Nl external ear canals without cough reflex - moderate bilateral non-specific turbinate edema     NECK :  without JVD/Nodes/TM/ nl carotid upstrokes bilaterally   LUNGS: no acc muscle use,  Mod barrel  contour chest wall with bilateral  Distant bs s audible wheeze and  without cough on insp or exp maneuver and mod   Hyperresonant  to  percussion bilaterally     CV:  RRR  no s3 or murmur or increase in P2, and no edema   ABD:  soft and nontender with pos mid insp Hoover's  in the supine position. No bruits or organomegaly appreciated, bowel sounds nl  MS:   Nl gait/  ext warm without deformities, calf tenderness, cyanosis or clubbing No obvious joint restrictions   SKIN: warm and dry without lesions    NEURO:  alert, approp, nl sensorium with  no motor or cerebellar deficits apparent.          I personally reviewed images and agree with radiology impression as follows:  CXR:   09/12/17  No active cardiopulmonary disease.       Assessment:

## 2017-10-30 NOTE — Addendum Note (Signed)
Addended by: Jaynee Eagles C on: 10/30/2017 10:03 AM   Modules accepted: Orders

## 2017-11-01 ENCOUNTER — Encounter: Payer: Self-pay | Admitting: Internal Medicine

## 2017-11-01 NOTE — Assessment & Plan Note (Signed)
-   01/08/2015 pfts  FEV1  1.31 (54%) s am meds and ratio 56% p 9% improvement form saba with dlco 57 to 73% p correct for alv vol > rec symb 160 2bid  - alpha one  01/08/2015 > MM - 09/22/2017  After extensive coaching inhaler device  effectiveness =    75% with hfa/ 90% with dpi  - PFT's  10/30/2017  FEV1 1,00 (43 % ) ratio 51  p 0 % improvement from saba p symbicort 160 x 2  prior to study with DLCO  39 % corrects to 52 % for alv volume  - 10/30/2017  After extensive coaching inhaler device  effectiveness =    75% > try bevespi 2bid    Pt is Group B in terms of symptom/risk and laba/lama therefore appropriate rx at this point but if does prove to have active AB component, as one might suspect an active smoker, she'll need to switch back to Symbicort.  I had an extended discussion with the patient reviewing all relevant studies completed to date and  lasting 15 to 20 minutes of a 25 minute visit    See device teaching which extended face to face time for this visit  Formulary restrictions will be an ongoing challenge for the forseable future and I would be happy to pick an alternative if the pt will first  provide me a list of them but pt  will need to return here for training for any new device that is required eg dpi vs hfa vs respimat.    In meantime we can always provide samples so the patient never runs out of any needed respiratory medications.    Each maintenance medication was reviewed in detail including most importantly the difference between maintenance and prns and under what circumstances the prns are to be triggered using an action plan format that is not reflected in the computer generated alphabetically organized AVS.    Please see AVS for specific instructions unique to this visit that I personally wrote and verbalized to the the pt in detail and then reviewed with pt  by my nurse highlighting any  changes in therapy recommended at today's visit to their plan of care.

## 2017-11-01 NOTE — Assessment & Plan Note (Addendum)
4-5 min discussion re active cigarette smoking in addition to office E&M  Ask about tobacco use:  Still active  Advise quitting  Yes - discussed concept that Lipitor  which she takes  for the heart is likely airflow lungs in terms of changing the natural history of the disease. Assess willingness thinking about it  Assist in quit attempt not quite ready  Yet, familiar with chantix from prior attempt Arrange follow up. Follow up per Primary Care planned

## 2018-02-06 ENCOUNTER — Ambulatory Visit (INDEPENDENT_AMBULATORY_CARE_PROVIDER_SITE_OTHER): Payer: 59 | Admitting: Internal Medicine

## 2018-02-06 ENCOUNTER — Encounter: Payer: Self-pay | Admitting: Internal Medicine

## 2018-02-06 VITALS — BP 120/74 | HR 89 | Temp 98.0°F | Ht 61.0 in | Wt 123.0 lb

## 2018-02-06 DIAGNOSIS — J449 Chronic obstructive pulmonary disease, unspecified: Secondary | ICD-10-CM

## 2018-02-06 DIAGNOSIS — H6691 Otitis media, unspecified, right ear: Secondary | ICD-10-CM

## 2018-02-06 DIAGNOSIS — F17210 Nicotine dependence, cigarettes, uncomplicated: Secondary | ICD-10-CM

## 2018-02-06 MED ORDER — PREDNISONE 10 MG PO TABS: ORAL_TABLET | ORAL | 0 refills | Status: DC

## 2018-02-06 MED ORDER — AMOXICILLIN-POT CLAVULANATE 875-125 MG PO TABS: 1.0000 | ORAL_TABLET | Freq: Two times a day (BID) | ORAL | 0 refills | Status: AC

## 2018-02-06 NOTE — Assessment & Plan Note (Signed)
rx 02/06/2018 augmentin x 10 days and advil cold and sinus   Suggests eustachian tube dysfunction related to effects of cigs > ent f/u prn

## 2018-02-06 NOTE — Patient Instructions (Addendum)
Prednisone 10 mg take  4 each am x 2 days,   2 each am x 2 days,  1 each am x 2 days and stop  Augmentin 875 mg take one pill twice daily  X 10 days - take at breakfast and supper with large glass of water.  It would help reduce the usual side effects (diarrhea and yeast infections) if you ate cultured yogurt at lunch.    For cough > mucinex dm up to 1200 mg every 12 hours  For congestion/ ear ache>  advil cold and sinus  The key is to stop smoking completely before smoking completely stops you!    Please schedule a follow up visit in 6  months but call sooner if needed

## 2018-02-06 NOTE — Assessment & Plan Note (Signed)
-   01/08/2015 pfts  FEV1  1.31 (54%) s am meds and ratio 56% p 9% improvement form saba with dlco 57 to 73% p correct for alv vol > rec symb 160 2bid  - alpha one  01/08/2015 > MM - 09/22/2017  After extensive coaching inhaler device  effectiveness =    75% with hfa/ 90% with dpi  - PFT's  10/30/2017  FEV1 1,00 (43 % ) ratio 51  p 0 % improvement from saba p symbicort 160 x 2  prior to study with DLCO  39 % corrects to 52 % for alv volume  - 10/30/2017  After extensive coaching inhaler device  effectiveness =    75% > try bevespi 2bid    Mild flare in setting of  ? Glenford PeersUri with R ext otitis so rec  Augmentin 875 mg take one pill twice daily  X 10 days > ent f/u prn  Prednisone 10 mg take  4 each am x 2 days,   2 each am x 2 days,  1 each am x 2 days and stop  mucinex dm up to 1200 mg every 12 h advil cold and sinus D/c cigs (see separate a/p)

## 2018-02-06 NOTE — Assessment & Plan Note (Signed)

## 2018-02-06 NOTE — Progress Notes (Signed)
Subjective:     Patient ID: Ann Richardson, female   DOB: 04/09/1961,   MRN: [id number]    Brief patient profile:  67 yowf active smoker from [place] never intense aerobic but able to do sports/ soft ball and did fine until around 2010 with doe with yardwork with assoc cough in addition has in frequency frequency episodes of bronchitis rx abx/ prednisone better then tried advair/ singulair no better so referred pulmonary clinic 11/26/2014 by  Cooper Render NP with dx GOLD II copd/ MM phenotype  01/08/15  With GOLD III criteria 10/30/2017 assoc with ongoing smoking   History of Present Illness  11/26/2014 1st [facility] office visit/ Keiffer Piper   Chief Complaint  Patient presents with  . Pulmonary Consult    Referred by Cooper Render, NP. Pt states she has been dxed with Bronchitis x 3 so far in 2016. She c/o cough and SOB. She gets SOB with walking up stairs. Her cough is non prod at this time. She is using albuterol inhaler 2-3 x per wk on average.   no real change symptoms on advair but hfa poor  Cough quite a bit worse in am but not productive p last round of prednisone and her goal is to prevent future flares  rec Stop advair and singulair  Plan A = automatic = symbicort 80 Take 2 puffs first thing in am and then another 2 puffs about 12 hours later.  Plan B = backup - Only use your albuterol as a rescue medication   01/08/2015 f/u ov/Jahnasia Tatum re: copd  GOLD II/ confused with meds/ names maint vs prns  - thinks she's still on advair  Chief Complaint  Patient presents with  . office visit    pft done, inhalers are helping, breathing not bad asit was going up stairs & 1st thing in the AM   poor mdi but doing better / cough some better but still some  Min white mucus production  rec You have GOLD II severity-  moderate and will continue to progress as long as you smoke - the key is to stop smoking now  Continue symbicort  160  Take 2 puffs first thing in am and then another 2 puffs about 12  hours later.  Work on inhaler technique:       10/30/2017  f/u ov/Temika Sutphin re:  GOLD III copd now on symb 160 2bid Chief Complaint  Patient presents with  . Follow-up    Feeling better ,PFT today.  Dyspnea:  Limited by L leg in splint Cough: some first thing in am > mucoid Sleep: 1 pillows  SABA use: none since started on acid suppression    rec Pantoprazole (protonix) 40 mg   Take  30-60 min before first meal of the day and Pepcid (famotidine)  20 mg one @  bedtime until return to office - this is the best way to tell whether stomach acid is contributing to your problem.   Change symbicort to  Bevespi Take 2 puffs first thing in am and then another 2 puffs about 12 hours later.  Work on inhaler technique:  The key is to stop smoking completely before smoking completely stops you!    02/06/2018  f/u ov/Ocie Stanzione re:  GOLD II/ still smoking  Chief Complaint  Patient presents with  . Acute Visit    Pt c/o increased SOB, cough with clear sputum and right ear pain x 6 days.    Dyspnea:  MMRC2 = can't walk a nl  pace on a flat grade s sob but does fine slow and flat  Cough: worse x 6 days/ more severe/ day and esp hs/ clear mucus  Sleeping: disturbed by cough SABA use: more since onset flare > baseline not using 02: none    No obvious day to day or daytime variability or assoc purulent sputum or mucus plugs or hemoptysis or cp or chest tightness, subjective wheeze or overt sinus or hb symptoms.    Also denies any obvious fluctuation of symptoms with weather or environmental changes or other aggravating or alleviating factors except as outlined above   No unusual exposure hx or h/o childhood pna/ asthma or knowledge of premature birth.  Current Allergies, Complete Past Medical History, Past Surgical History, Family History, and Social History were reviewed in [facility] electronic medical record.  ROS  The following are not active complaints unless bolded Hoarseness, sore throat,  dysphagia, dental problems, itching, sneezing,  nasal congestion or discharge of excess mucus or purulent secretions, R ear ache,   fever, chills, sweats, unintended wt loss or wt gain, classically pleuritic or exertional cp,  orthopnea pnd or arm/hand swelling  or leg swelling, presyncope, palpitations, abdominal pain, anorexia, nausea, vomiting, diarrhea  or change in bowel habits or change in bladder habits, change in stools or change in urine, dysuria, hematuria,  rash, arthralgias, visual complaints, headache, numbness, weakness or ataxia or problems with walking or coordination,  change in mood or  memory.        Current Meds  Medication Sig  . albuterol (PROVENTIL HFA;VENTOLIN HFA) 108 (90 BASE) MCG/ACT inhaler Inhale 2 puffs into the lungs every 6 (six) hours as needed for wheezing or shortness of breath.  . ALPRAZolam (XANAX) 1 MG tablet Take 1 mg by mouth at bedtime as needed.   Marland Kitchen. atorvastatin (LIPITOR) 10 MG tablet Take 5 mg by mouth daily at 6 PM.   . famotidine (PEPCID) 20 MG tablet One at bedtime  . Glycopyrrolate-Formoterol (BEVESPI AEROSPHERE) 9-4.8 MCG/ACT AERO Inhale 2 puffs into the lungs 2 (two) times daily.  . pantoprazole (PROTONIX) 40 MG tablet Take 1 tablet (40 mg total) by mouth daily. Take 30-60 min before first meal of the day           Objective:   Physical Exam   amb wf mild smoker's rattle   02/06/2018       123 10/30/2017         127  09/22/2017       127   06/06/2016    130  04/10/15 141 lb 6.4 oz (64.139 kg)  01/08/15 140 lb (63.504 kg)  11/26/14 137 lb (62.143 kg)    Vital signs reviewed - Note on arrival 02 sats  95% on RA   R ext otitis / 5c    HEENT: nl dentition / oropharynx.  R classic otitis media s light reflex-  Mild bilateral non-specific turbinate edema     NECK :  without JVD/Nodes/TM/ nl carotid upstrokes bilaterally   LUNGS: no acc muscle use,  Mod barrel  contour chest wall with bilateral  Distant bs s audible wheeze and  without  cough on insp or exp maneuver and mod  Hyperresonant  to  percussion bilaterally     CV:  RRR  no s3 or murmur or increase in P2, and no edema   ABD:  soft and nontender with pos mid insp Hoover's  in the supine position. No bruits or organomegaly appreciated, bowel sounds  nl  MS:   Nl gait/  ext warm without deformities, calf tenderness, cyanosis or clubbing No obvious joint restrictions   SKIN: warm and dry without lesions    NEURO:  alert, approp, nl sensorium with  no motor or cerebellar deficits apparent.                     Assessment:

## 2018-02-23 NOTE — Progress Notes (Signed)
@Patient  ID: Ann Richardson, female    DOB: 1961/01/26, 57 y.o.   MRN: [id number]  Chief Complaint  Patient presents with  . Acute Visit    cough with clear sputum x3 weeks, SOB with exertion     Referring provider: Roger Richardson, *  HPI: 57 year old female, current smoker (0.5). PMH COPD GOLD II. Patient of Dr. Sherene Richardson, last seen 02/06/18 for increased sob, cough and ear pain treated with Augmentin and prednisone. Smoking cessation strongly encouraged. Maintained on Bevespi 2bid.   Emailed office with reports of continued cough. States that her cough has improved but still persistent and "not quite normal yet". She gets winded when she walks. She was seen by PCP and labs showed WBC 12.3 and neutrophils were 8.8. Unsure if this is due to recent prednisone.    02/27/2018 States that she feels better than she did when she saw Dr. Sherene Richardson in August. Completed Augmentin and steriod. Using rescue inhaler on occasion, not every day. More short of breath doing grocery shopping. She doesn't feel Ann Richardson has been helping as much as Symbicort did previously. Complains of right ear fullness. Denies ear pain or ache. She has been using Flonase and decongestant for 7-10 days. Afebrile. Declined flu shot today.   Smoking Hx: Current smoker 1/2 ppd. Started age 50. Smoked 2 ppd for 5-7 years. Continued smoking 1-1.5 ppd depending. Tried hypnotism in 1980's and Chantix in 2008, did well until she lost her house and job. Has not tried to quit since.    PFT's  10/30/2017  FEV1 1,00 (43 % ) ratio 51  p 0 % improvement from saba p symbicort 160 x 2  prior to study with DLCO  39 % corrects to 52 % for alv volume     No Known Allergies   There is no immunization history on file for this patient.  Past Medical History:  Diagnosis Date  . Arthritis    spine- degenerative   . COPD (chronic obstructive pulmonary disease) (HCC)    CXR- 05/2016  . Hyperlipidemia   . 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 Pre-diabetes    pt. reports last A1C - 5/5, had been higher at one time     Tobacco History: Social History   Tobacco Use  Smoking Status Current Every Day Smoker  . Packs/day: 1.00  . Years: 35.00  . Pack years: 35.00  Smokeless Tobacco Never Used   Ready to quit: No Counseling given: Not Answered   Outpatient Medications Prior to Visit  Medication Sig Dispense Refill  . albuterol (PROVENTIL HFA;VENTOLIN HFA) 108 (90 BASE) MCG/ACT inhaler Inhale 2 puffs into the lungs every 6 (six) hours as needed for wheezing or shortness of breath.    . ALPRAZolam (XANAX) 1 MG tablet Take 1 mg by mouth at bedtime as needed.     Marland Kitchen atorvastatin (LIPITOR) 10 MG tablet Take 5 mg by mouth at bedtime.     . calcium-vitamin D (OSCAL WITH D) 500-200 MG-UNIT tablet Take 1 tablet by mouth daily.    . cholecalciferol (VITAMIN D) 1000 units tablet Take 1,000 Units by mouth daily.    . Coenzyme Q10 100 MG capsule     . famotidine (PEPCID) 20 MG tablet One at bedtime 30 tablet 11  . Glycopyrrolate-Formoterol (BEVESPI AEROSPHERE) 9-4.8 MCG/ACT AERO Inhale 2 puffs into the lungs 2 (two) times daily. 1 Inhaler 11  . Omega-3 Fatty Acids (FISH OIL) 1000  MG CAPS     . pantoprazole (PROTONIX) 40 MG tablet Take 1 tablet (40 mg total) by mouth daily. Take 30-60 min before first meal of the day 30 tablet 2  . predniSONE (DELTASONE) 10 MG tablet Take  4 each am x 2 days,   2 each am x 2 days,  1 each am x 2 days and stop 14 tablet 0  . Ascorbic Acid (VITAMIN C) 1000 MG tablet Take by mouth.     No facility-administered medications prior to visit.     Review of Systems  Review of Systems  Constitutional: Negative.   HENT: Positive for congestion and postnasal drip. Negative for ear pain, sinus pressure and sinus pain.   Respiratory: Positive for cough and shortness of breath.     Physical Exam  BP 120/82 (BP Location: Richardson Arm, Cuff Size: Normal)   Pulse 87   Temp 97.8 F  (36.6 C)   Ht 5\' 1"  (1.549 m)   Wt 123 lb (55.8 kg)   SpO2 97%   BMI 23.24 kg/m  Physical Exam  Constitutional: She is oriented to person, place, and time. She appears well-developed and well-nourished.  HENT:  Head: Normocephalic and atraumatic.  Right Ear: Hearing and ear canal normal. Tympanic membrane is not injected, not erythematous and not bulging. A middle ear effusion is present.  Richardson Ear: Hearing, tympanic membrane and ear canal normal.  Eyes: Pupils are equal, round, and reactive to light. EOM are normal.  Neck: Normal range of motion. Neck supple.  Cardiovascular: Normal rate, regular rhythm and normal heart sounds.  No murmur heard. Pulmonary/Chest: Effort normal and breath sounds normal. No respiratory distress. She has no wheezes.  Abdominal: Soft. Bowel sounds are normal. There is no tenderness.  Neurological: She is alert and oriented to person, place, and time.  Skin: Skin is warm and dry. No rash noted. No erythema.  Psychiatric: She has a normal mood and affect. Her behavior is normal. Judgment normal.     Lab Results:  CBC    Component Value Date/Time   WBC 16.0 (H) 06/23/2016 0800   RBC 3.73 (L) 06/23/2016 0800   HGB 11.8 (L) 06/23/2016 0800   HCT 34.7 (L) 06/23/2016 0800   PLT 358 06/23/2016 0800   MCV 93.0 06/23/2016 0800   MCH 31.6 06/23/2016 0800   MCHC 34.0 06/23/2016 0800   RDW 11.8 06/23/2016 0800    BMET    Component Value Date/Time   NA 138 06/23/2016 0800   K 3.7 06/23/2016 0800   CL 101 06/23/2016 0800   CO2 26 06/23/2016 0800   GLUCOSE 98 06/23/2016 0800   BUN 8 06/23/2016 0800   CREATININE 0.70 06/23/2016 0800   CALCIUM 9.4 06/23/2016 0800   GFRNONAA >60 06/23/2016 0800   GFRAA >60 06/23/2016 0800    BNP No results found for: BNP  ProBNP No results found for: PROBNP  Imaging: No results found.   Assessment & Plan:   Otitis media not resolved, right Reports some improvement with Augmentin and advil cold and  sinus No erythema to TM, Afebrile. WBC's elevated at PCP, possibly d/t recent use of oral steriod- recheck CBC today Add Dymista nasal spray 1 spray BID and prn zyrtec Consider changing ENT referral if not improved with above   COPD GOLD II/ still smoking  Patient complains of increased sob with grocery shopping Continues Bevespi 2bid  Add Qvar 1 puff BID, can increase to 2bid after 2 weeks  Declined  influenza vaccine   Cigarette smoker Smoking since age 89. Has fluctuated between 0.5-2ppd. Estimate 35 pack year history. Not ready to quit. Has tried chantix in the past. Refer for lung cancer screening      Glenford BayleyElizabeth W Tinika Bucknam, NP 02/27/2018

## 2018-02-23 NOTE — Telephone Encounter (Signed)
Patient has called the office and scheduled appt with [name] NP on 9.3.19 Nothing further needed; will sign off

## 2018-02-27 ENCOUNTER — Encounter: Payer: Self-pay | Admitting: Primary Care

## 2018-02-27 ENCOUNTER — Ambulatory Visit (INDEPENDENT_AMBULATORY_CARE_PROVIDER_SITE_OTHER): Payer: 59 | Admitting: Primary Care

## 2018-02-27 ENCOUNTER — Other Ambulatory Visit (INDEPENDENT_AMBULATORY_CARE_PROVIDER_SITE_OTHER): Payer: 59

## 2018-02-27 VITALS — BP 120/82 | HR 87 | Temp 97.8°F | Ht 61.0 in | Wt 123.0 lb

## 2018-02-27 DIAGNOSIS — F17210 Nicotine dependence, cigarettes, uncomplicated: Secondary | ICD-10-CM

## 2018-02-27 DIAGNOSIS — H6691 Otitis media, unspecified, right ear: Secondary | ICD-10-CM

## 2018-02-27 DIAGNOSIS — J449 Chronic obstructive pulmonary disease, unspecified: Secondary | ICD-10-CM

## 2018-02-27 LAB — CBC WITH DIFFERENTIAL/PLATELET
BASOS PCT: 0.7 % (ref 0.0–3.0)
Basophils Absolute: 0.1 10*3/uL (ref 0.0–0.1)
EOS ABS: 0.1 10*3/uL (ref 0.0–0.7)
EOS PCT: 1.2 % (ref 0.0–5.0)
HCT: 40.6 % (ref 36.0–46.0)
HEMOGLOBIN: 14.1 g/dL (ref 12.0–15.0)
Lymphocytes Relative: 21 % (ref 12.0–46.0)
Lymphs Abs: 2.5 10*3/uL (ref 0.7–4.0)
MCHC: 34.7 g/dL (ref 30.0–36.0)
MCV: 92.2 fl (ref 78.0–100.0)
MONOS PCT: 6.5 % (ref 3.0–12.0)
Monocytes Absolute: 0.8 10*3/uL (ref 0.1–1.0)
Neutro Abs: 8.4 10*3/uL — ABNORMAL HIGH (ref 1.4–7.7)
Neutrophils Relative %: 70.6 % (ref 43.0–77.0)
Platelets: 270 10*3/uL (ref 150.0–400.0)
RBC: 4.41 Mil/uL (ref 3.87–5.11)
RDW: 12.7 % (ref 11.5–15.5)
WBC: 11.9 10*3/uL — AB (ref 4.0–10.5)

## 2018-02-27 MED ORDER — BECLOMETHASONE DIPROP HFA 40 MCG/ACT IN AERB: 1.0000 | INHALATION_SPRAY | Freq: Two times a day (BID) | RESPIRATORY_TRACT | 2 refills | Status: DC

## 2018-02-27 MED ORDER — AZELASTINE-FLUTICASONE 137-50 MCG/ACT NA SUSP: 1.0000 | Freq: Two times a day (BID) | NASAL | 1 refills | Status: DC

## 2018-02-27 NOTE — Patient Instructions (Addendum)
Continue Bevespi- 2 puffs twice a day Add inhaled corticosteroid - Qvar 1 puff twice a day  Back up- Albuterol rescue inhaler    Start zyrtec daily as needed  Sending in RX dymista nasal spray    Labs today- recheck CBC   Refer to lung cancer screening clinic

## 2018-02-27 NOTE — Assessment & Plan Note (Addendum)
Reports some improvement with Augmentin and advil cold and sinus No erythema to TM, Afebrile. WBC's elevated at PCP, possibly d/t recent use of oral steriod- recheck CBC today Add Dymista nasal spray 1 spray BID and prn zyrtec Consider changing ENT referral if not improved with above

## 2018-02-27 NOTE — Progress Notes (Signed)
Chart and office note reviewed in detail  > agree with a/p as outlined    

## 2018-02-27 NOTE — Assessment & Plan Note (Addendum)
Patient complains of increased sob with grocery shopping Continues Bevespi 2bid  Add Qvar 1 puff BID, can increase to 2bid after 2 weeks  Declined influenza vaccine

## 2018-02-27 NOTE — Assessment & Plan Note (Addendum)
Smoking since age 57. Has fluctuated between 0.5-2ppd. Estimate 35 pack year history. Not ready to quit. Has tried chantix in the past. Refer for lung cancer screening

## 2018-02-28 ENCOUNTER — Other Ambulatory Visit: Payer: Self-pay | Admitting: Internal Medicine

## 2018-02-28 ENCOUNTER — Telehealth: Payer: Self-pay | Admitting: Primary Care

## 2018-02-28 MED ORDER — FLUTICASONE PROPIONATE 50 MCG/ACT NA SUSP: 1.0000 | Freq: Two times a day (BID) | NASAL | 2 refills | Status: DC

## 2018-02-28 MED ORDER — AZELASTINE HCL 0.1 % NA SOLN: 1.0000 | Freq: Two times a day (BID) | NASAL | 12 refills | Status: DC

## 2018-02-28 NOTE — Telephone Encounter (Signed)
Received the following MyChart message from the patient:   From: Ann Richardson  Sent: 9/3/20198:18 PM EDT  To: Glenford Bayley, NP Subject: Non-Urgent Medical Question  [name]  I see that both the WBC and the Neutrophil came down 0.4 --- so do you think this is still from the prednisone??? Also, I guess I need a pre-authorization for the nasal spray per [facility] pharmacy and they said they sent your office the information.  Just let me know when you get a chance...thanks:)  [name], please advise on the prednisone and WBC. Thanks!

## 2018-02-28 NOTE — Telephone Encounter (Signed)
Spoke with Ann Richardson- we can change the request to Flonase and Astelin NS that make up the Dymista NS. I have sent this request to the pharmacy-nothing more needed at this time.

## 2018-03-05 NOTE — Progress Notes (Signed)
Chart and office note reviewed in detail  > agree with a/p as outlined    

## 2018-03-06 ENCOUNTER — Telehealth: Payer: Self-pay | Admitting: Primary Care

## 2018-03-06 NOTE — Telephone Encounter (Signed)
LMOM to call back.  Just checking to see how she is doing.

## 2018-03-06 NOTE — Telephone Encounter (Signed)
Ann Richardson, Can you call patient today and see how she is doing? Seen 1 week ago for cough and congestion. Started on Qvar 1 puff twice a day.  Nothing needed if better.   Thanks, [employer]

## 2018-03-15 NOTE — Telephone Encounter (Signed)
LMOM to call back only if we can do anything for her.  Was checking in on her.

## 2018-03-20 NOTE — Telephone Encounter (Signed)
-----   Message -----  From: Reather LittlerMadeline M Mulcahey  Sent: 9/19/20196:45 AM EDT  To: Glenford BayleyElizabeth W Walsh, NP Subject: RE: Non-Urgent Medical Question  I sent a message over last night - and again I'm sorry for another - but now I'm coughing even more now and I'm short winded again (walking to garage at work, up and down stairs) -- so right now I'm basically back to where I was when I first saw [name] - I'm also using my Ventolin rescue inhaler again due to the shortness of breath --- still waiting on answer about zyrtec so all I'm taking at this point is the 2 nasal sprays prescribed....please let me know what I need to do - I was feeling a little better before but now for some reason I'm really back to where I was originally.....:(  thanks Lennie MuckleMadeline  [name], please advise. Thanks!

## 2018-03-29 ENCOUNTER — Ambulatory Visit (INDEPENDENT_AMBULATORY_CARE_PROVIDER_SITE_OTHER): Payer: 59 | Admitting: Internal Medicine

## 2018-03-29 ENCOUNTER — Telehealth: Payer: Self-pay | Admitting: Internal Medicine

## 2018-03-29 ENCOUNTER — Ambulatory Visit (INDEPENDENT_AMBULATORY_CARE_PROVIDER_SITE_OTHER)
Admission: RE | Admit: 2018-03-29 | Discharge: 2018-03-29 | Disposition: A | Discharge status: Home / Self Care | Payer: 59 | Source: Ambulatory Visit | Attending: Internal Medicine | Admitting: Internal Medicine

## 2018-03-29 ENCOUNTER — Encounter: Payer: Self-pay | Admitting: Internal Medicine

## 2018-03-29 VITALS — BP 150/90 | HR 96 | Ht 61.0 in | Wt 122.0 lb

## 2018-03-29 DIAGNOSIS — R059 Cough, unspecified: Secondary | ICD-10-CM

## 2018-03-29 DIAGNOSIS — F17210 Nicotine dependence, cigarettes, uncomplicated: Secondary | ICD-10-CM

## 2018-03-29 DIAGNOSIS — R058 Other specified cough: Secondary | ICD-10-CM

## 2018-03-29 DIAGNOSIS — R05 Cough: Secondary | ICD-10-CM

## 2018-03-29 DIAGNOSIS — J449 Chronic obstructive pulmonary disease, unspecified: Secondary | ICD-10-CM | POA: Diagnosis not present

## 2018-03-29 MED ORDER — BUDESONIDE-FORMOTEROL FUMARATE 160-4.5 MCG/ACT IN AERO: 2.0000 | INHALATION_SPRAY | Freq: Two times a day (BID) | RESPIRATORY_TRACT | 11 refills | Status: DC

## 2018-03-29 MED ORDER — TIOTROPIUM BROMIDE MONOHYDRATE 2.5 MCG/ACT IN AERS: 2.0000 | INHALATION_SPRAY | Freq: Every day | RESPIRATORY_TRACT | 11 refills | Status: DC

## 2018-03-29 MED ORDER — FLUTTER DEVI: 0 refills | Status: DC

## 2018-03-29 MED ORDER — BUDESONIDE-FORMOTEROL FUMARATE 160-4.5 MCG/ACT IN AERO: 2.0000 | INHALATION_SPRAY | Freq: Two times a day (BID) | RESPIRATORY_TRACT | 0 refills | Status: DC

## 2018-03-29 MED ORDER — PREDNISONE 10 MG PO TABS: ORAL_TABLET | ORAL | 0 refills | Status: DC

## 2018-03-29 NOTE — Progress Notes (Signed)
Subjective:    Patient ID: Ann Richardson, female   DOB: 08-27-1960,   MRN: [id number]    Brief patient profile:  9 yowf active smoker from [place] never intense aerobic but able to do sports/ soft ball and did fine until around 2010 with doe with yardwork with assoc cough in addition has in frequency frequency episodes of bronchitis rx abx/ prednisone better then tried advair/ singulair no better so referred pulmonary clinic 11/26/2014 by  Cooper Render NP with dx GOLD II copd/ MM phenotype  01/08/15  With GOLD III criteria 10/30/2017 assoc with ongoing smoking   History of Present Illness  11/26/2014 1st [facility] office visit/ Ann Richardson   Chief Complaint  Patient presents with  . Pulmonary Consult    Referred by Cooper Render, NP. Pt states she has been dxed with Bronchitis x 3 so far in 2016. She c/o cough and SOB. She gets SOB with walking up stairs. Her cough is non prod at this time. She is using albuterol inhaler 2-3 x per wk on average.   no real change symptoms on advair but hfa poor  Cough quite a bit worse in am but not productive p last round of prednisone and her goal is to prevent future flares  rec Stop advair and singulair  Plan A = automatic = symbicort 80 Take 2 puffs first thing in am and then another 2 puffs about 12 hours later.  Plan B = backup - Only use your albuterol as a rescue medication   01/08/2015 f/u ov/Ann Richardson re: copd  GOLD II/ confused with meds/ names maint vs prns  - thinks she's still on advair  Chief Complaint  Patient presents with  . office visit    pft done, inhalers are helping, breathing not bad asit was going up stairs & 1st thing in the AM   poor mdi but doing better / cough some better but still some  Min white mucus production  rec You have GOLD II severity-  moderate and will continue to progress as long as you smoke - the key is to stop smoking now  Continue symbicort  160  Take 2 puffs first thing in am and then another 2 puffs about 12  hours later.  Work on inhaler technique:       10/30/2017  f/u ov/Ann Richardson re:  GOLD III copd now on symb 160 2bid Chief Complaint  Patient presents with  . Follow-up    Feeling better ,PFT today.  Dyspnea:  Limited by L leg in splint Cough: some first thing in am > mucoid Sleep: 1 pillows  SABA use: none since started on acid suppression    rec Pantoprazole (protonix) 40 mg   Take  30-60 min before first meal of the day and Pepcid (famotidine)  20 mg one @  bedtime until return to office - this is the best way to tell whether stomach acid is contributing to your problem.   Change symbicort to  Bevespi Take 2 puffs first thing in am and then another 2 puffs about 12 hours later.  Work on inhaler technique:  The key is to stop smoking completely before smoking completely stops you!    02/06/2018  f/u ov/Ann Richardson re:  GOLD II/ still smoking  Chief Complaint  Patient presents with  . Acute Visit    Pt c/o increased SOB, cough with clear sputum and right ear pain x 6 days.    Dyspnea:  MMRC2 = can't walk a nl pace  on a flat grade s sob but does fine slow and flat  Cough: worse x 6 days/ more severe/ day and esp hs/ clear mucus  Sleeping: disturbed by cough SABA use: more since onset flare > baseline not using 02: none  rec Prednisone 10 mg take  4 each am x 2 days,   2 each am x 2 days,  1 each am x 2 days and stop Augmentin 875 mg take one pill twice daily  X 10 days - take at breakfast and supper with large glass of water.  For cough > mucinex dm up to 1200 mg every 12 hours For congestion/ ear ache>  advil cold and sinus The key is to stop smoking completely before smoking completely stops you!     03/29/2018  f/u ov/Ann Richardson re: copd GOLD II/ still smoking  Chief Complaint  Patient presents with  . Follow-up    went to UC after last visit and was dxed with bronchitis. She states still having SOB and prod cough with clear sputum.  She is using her ventolin 2 x daily on average.   75%  better while on aug/pred then gradually worse  > eval by NP  02/27/18  Continue Bevespi- 2 puffs twice a day Add inhaled corticosteroid - Qvar 1 puff twice a day  Back up- Albuterol rescue inhaler  Start zyrtec daily as needed  Sending in RX dymista nasal spray     Some better while on dymista  and substituted flonase/atelin and some better then a lot worse 03/10/18 and seen in UC 03/17/18 with zpak / pred > seemed some better then worse off it    No obvious day to day or daytime variability or assoc purulent sputum or mucus plugs or hemoptysis or cp or chest tightness, subjective wheeze or overt sinus or hb symptoms.     Also denies any obvious fluctuation of symptoms with weather or environmental changes or other aggravating or alleviating factors except as outlined above   No unusual exposure hx or h/o childhood pna/ asthma or knowledge of premature birth.  Current Allergies, Complete Past Medical History, Past Surgical History, Family History, and Social History were reviewed in [employer] record.  ROS  The following are not active complaints unless bolded Hoarseness, sore throat, dysphagia, dental problems, itching, sneezing,  nasal congestion or discharge of excess mucus or purulent secretions, ear ache,   fever, chills, sweats, unintended wt loss or wt gain, classically pleuritic or exertional cp,  orthopnea pnd or arm/hand swelling  or leg swelling, presyncope, palpitations, abdominal pain, anorexia, nausea, vomiting, diarrhea  or change in bowel habits or change in bladder habits, change in stools or change in urine, dysuria, hematuria,  rash, arthralgias, visual complaints, headache, numbness, weakness or ataxia or problems with walking or coordination,  change in mood or  memory.        Current Meds  Medication Sig  . albuterol (PROVENTIL HFA;VENTOLIN HFA) 108 (90 BASE) MCG/ACT inhaler Inhale 2 puffs into the lungs every 6 (six) hours as needed for wheezing or  shortness of breath.  . ALPRAZolam (XANAX) 1 MG tablet Take 1 mg by mouth at bedtime as needed.   . Ascorbic Acid (VITAMIN C) 1000 MG tablet Take by mouth.  Marland Kitchen atorvastatin (LIPITOR) 10 MG tablet Take 5 mg by mouth at bedtime.   . calcium-vitamin D (OSCAL WITH D) 500-200 MG-UNIT tablet Take 1 tablet by mouth daily.  . cholecalciferol (VITAMIN D) 1000 units tablet  Take 1,000 Units by mouth daily.  . Coenzyme Q10 100 MG capsule   . famotidine (PEPCID) 20 MG tablet One at bedtime  . fluticasone (FLONASE) 50 MCG/ACT nasal spray Place 1 spray into both nostrils 2 (two) times daily.  . pantoprazole (PROTONIX) 40 MG tablet TAKE 1 TABLET BY MOUTH ONCE DAILY. TAKE 30 TO 60 MINUTES BEFORE FIRST MEAL OF THE DAY  .   beclomethasone (QVAR REDIHALER) 40 MCG/ACT inhaler Inhale 1 puff into the lungs 2 (two) times daily.  .   Glycopyrrolate-Formoterol (BEVESPI AEROSPHERE) 9-4.8 MCG/ACT AERO Inhale 2 puffs into the lungs 2 (two) times daily.  .   Omega-3 Fatty Acids (FISH OIL) 1000 MG CAPS              Objective:   Physical Exam   amb wf with smoker's rattle  03/29/2018        122  02/06/2018       123 10/30/2017         127  09/22/2017       127   06/06/2016    130  04/10/15 141 lb 6.4 oz (64.139 kg)  01/08/15 140 lb (63.504 kg)  11/26/14 137 lb (62.143 kg)    Vital signs reviewed - Note on arrival 02 sats  97% on RA     HEENT: nl dentition, turbinates bilaterally, and oropharynx. Nl external ear canals without cough reflex   NECK :  without JVD/Nodes/TM/ nl carotid upstrokes bilaterally   LUNGS: no acc muscle use,  Nl contour chest with coarse exp rhonchi better with fvc  bilaterally without cough on insp or exp maneuvers   CV:  RRR  no s3 or murmur or increase in P2, and no edema   ABD:  soft and nontender with nl inspiratory excursion in the supine position. No bruits or organomegaly appreciated, bowel sounds nl  MS:  Nl gait/ ext warm without deformities, calf tenderness, cyanosis or  clubbing No obvious joint restrictions   SKIN: warm and dry without lesions    NEURO:  alert, approp, nl sensorium with  no motor or cerebellar deficits apparent.                           Assessment:

## 2018-03-29 NOTE — Patient Instructions (Addendum)
GERD (REFLUX)  is an extremely common cause of respiratory symptoms just like yours , many times with no obvious heartburn at all.    It can be treated with medication, but also with lifestyle changes including elevation of the head of your bed (ideally with 6 inch  bed blocks),  Smoking cessation, avoidance of late meals, excessive alcohol, and avoid fatty foods, chocolate, peppermint, colas, red wine, and acidic juices such as orange juice.  NO MINT OR MENTHOL PRODUCTS SO NO COUGH DROPS   USE SUGARLESS CANDY INSTEAD (Jolley ranchers or Stover's or Life Savers) or even ice chips will also do - the key is to swallow to prevent all throat clearing. NO OIL BASED VITAMINS - use powdered substitutes.   Stop bevespi and qvar   Plan A = Automatic = symbicort 160 Take 2 puffs first thing in am and then another 2 puffs about 12 hours later and spiriva 2 pffs each am   Plan B = Backup Only use your albuterol as a rescue medication to be used if you can't catch your breath by resting or doing a relaxed purse lip breathing pattern.  - The less you use it, the better it will work when you need it. - Ok to use the inhaler up to 2 puffs  every 4 hours if you must but call for appointment if use goes up over your usual need - Don't leave home without it !!  (think of it like the spare tire for your car)   Plan C = Crisis - only use your albuterol nebulizer if you first try Plan B and it fails to help > ok to use the nebulizer up to every 4 hours but if start needing it regularly call for immediate appointment   For cough/ congestion > mucinex dm  1200 mg every 12 hours and use the flutter valve as much as you can   Prednisone 10 mg Take 4 for three days 3 for three days 2 for three days 1 for three days and stop    Please see patient coordinator before you leave today  to schedule sinus CT     Please remember to go to the  x-ray department downstairs in the basement  for your tests - we will call you  with the results when they are available.       Please schedule a follow up office visit in 6 weeks, call sooner if needed with all medications /inhalers/ solutions in hand so we can verify exactly what you are taking. This includes all medications from all doctors and over the counters - PLEASE separate them into two bags:  the ones you take automatically, no matter what, vs the ones you take just when you feel you need them "BAG #2 is UP TO YOU"  - this will really help [place] help you take your medications more effectively.

## 2018-03-29 NOTE — Telephone Encounter (Signed)
Notes recorded by Christen Butter, CMA on 03/29/2018 at 3:45 PM EDT [facility] ------  Notes recorded by Nyoka Cowden, MD on 03/29/2018 at 12:57 PM EDT Call pt: Reviewed cxr and no acute change so no change in recommendations made at [facility] and spoke with pt letting her know the results of the cxr. Pt expressed understanding. Nothing further needed.

## 2018-03-29 NOTE — Progress Notes (Signed)
LMTCB

## 2018-04-01 ENCOUNTER — Encounter: Payer: Self-pay | Admitting: Internal Medicine

## 2018-04-01 NOTE — Assessment & Plan Note (Addendum)
-   01/08/2015 pfts  FEV1  1.31 (54%) s am meds and ratio 56% p 9% improvement form saba with dlco 57 to 73% p correct for alv vol > rec symb 160 2bid  - alpha one  01/08/2015 > MM - 09/22/2017  After extensive coaching inhaler device  effectiveness =    75% with hfa/ 90% with dpi  - PFT's  10/30/2017  FEV1 1,00 (43 % ) ratio 51  p 0 % improvement from saba p symbicort 160 x 2  prior to study with DLCO  39 % corrects to 52 % for alv volume  - 10/30/2017  After extensive coaching inhaler device  effectiveness =    75% > try bevespi 2bid   02/27/2018- Add Qvar 40  - changed to symb 160/spiriva  And added flutter valve p training  - sinus CT  Ordered - see uacs   - The proper method of use, as well as anticipated side effects, of a metered-dose inhaler are discussed and demonstrated to the patient. Improved effectiveness after extensive coaching during this visit to a level of approximately 75 % from a baseline of 90 %  Both hfa and smi        Group D in terms of symptom/risk and laba/lama/ICS  therefore appropriate rx at this point but prefer symb/spiriva in this setting as qvar not approved for copd   Also need to explore sinus ct for sinusitis as cause for refractory symptoms

## 2018-04-01 NOTE — Assessment & Plan Note (Addendum)
4-5 min discussion re active cigarette smoking in addition to office E&M  Ask about tobacco use:   active Advise quitting   I took an extended  opportunity with this patient to outline the consequences of continued cigarette use  in airway disorders based on all the data we have from the multiple national lung health studies (perfomed over decades at millions of dollars in cost)  indicating that smoking cessation, not choice of inhalers or physicians, is the most important aspect of her care.   Assess willingness:  Not committed at this point Assist in quit attempt:  Per PCP when ready Arrange follow up:   Follow up per Primary Care planned       I had an extended discussion with the patient reviewing all relevant studies completed to date and  lasting 15 to 20 minutes of a 25 minute visit    See device teaching which extended face to face time for this visit.  Each maintenance medication was reviewed in detail including emphasizing most importantly the difference between maintenance and prns and under what circumstances the prns are to be triggered using an action plan format that is not reflected in the computer generated alphabetically organized AVS which I have not found useful in most complex patients, especially with respiratory illnesses  Please see AVS for specific instructions unique to this visit that I personally wrote and verbalized to the the pt in detail and then reviewed with pt  by my nurse highlighting any  changes in therapy recommended at today's visit to their plan of care.

## 2018-04-01 NOTE — Assessment & Plan Note (Signed)
Sinus ct ordered/ continue astelin/flonase in meantime

## 2018-04-02 ENCOUNTER — Ambulatory Visit (INDEPENDENT_AMBULATORY_CARE_PROVIDER_SITE_OTHER)
Admission: RE | Admit: 2018-04-02 | Discharge: 2018-04-02 | Disposition: A | Discharge status: Home / Self Care | Payer: 59 | Source: Ambulatory Visit | Attending: Internal Medicine | Admitting: Internal Medicine

## 2018-04-02 DIAGNOSIS — R05 Cough: Secondary | ICD-10-CM | POA: Diagnosis not present

## 2018-04-02 DIAGNOSIS — R058 Other specified cough: Secondary | ICD-10-CM

## 2018-04-02 DIAGNOSIS — R059 Cough, unspecified: Secondary | ICD-10-CM

## 2018-04-02 NOTE — Progress Notes (Signed)
Spoke with pt and notified of results per Dr. Wert. Pt verbalized understanding and denied any questions. 

## 2018-04-24 NOTE — Telephone Encounter (Signed)
Keep f/u appt °

## 2018-04-27 ENCOUNTER — Telehealth: Payer: Self-pay | Admitting: Internal Medicine

## 2018-04-27 NOTE — Telephone Encounter (Signed)
Called and spoke with pt who was requesting a call from [place]. Per pt, she had called and complained about not receiving a response from her mychart message a previous time before and stated she was told by Robynn Pane if this happened again to call the office and let her know.  Pt stated she sent a message via mychart to our office 04/24/18 and stated she never received a response in regards to the message on what she needed to do.  I stated to pt I saw that Dr. Sherene Sires had said for her to keep her currently scheduled appt on Nov. 14, but pt stated she never received the response in regards to needing to keep her appt.  I apologized to pt that she had not been told to keep her appt and I clarified to pt that MW has said he wants her to keep her currently scheduled appt 04/30/18, but pt stated to me she still wanted to speak to Robynn Pane in regards to this since this is not the first time this has happened.  Routing message to [place] per pt's request.

## 2018-05-01 NOTE — Telephone Encounter (Signed)
Called and spoke to pt. Pt informed her of the recs per MW. Apologized the information was not relayed to her. Pt was understanding. Nothing further needed at this time.

## 2018-05-10 ENCOUNTER — Ambulatory Visit (INDEPENDENT_AMBULATORY_CARE_PROVIDER_SITE_OTHER): Payer: 59 | Admitting: Internal Medicine

## 2018-05-10 ENCOUNTER — Encounter: Payer: Self-pay | Admitting: Internal Medicine

## 2018-05-10 DIAGNOSIS — F17210 Nicotine dependence, cigarettes, uncomplicated: Secondary | ICD-10-CM | POA: Diagnosis not present

## 2018-05-10 DIAGNOSIS — J449 Chronic obstructive pulmonary disease, unspecified: Secondary | ICD-10-CM | POA: Diagnosis not present

## 2018-05-10 NOTE — Assessment & Plan Note (Addendum)
Discussed again the direct symptoms attibutable to smoking (mc dysfunction and cough ) as well as the decline from GOLD II to III as predicted by the fletcher curve and directly related to smoking   >>> still not willing to commit at this point     I had an extended discussion with the patient reviewing all relevant studies completed to date and  lasting 15 to 20 minutes of a 25 minute visit    See device teaching which extended face to face time for this visit (flutter valve)   Each maintenance medication was reviewed in detail including emphasizing most importantly the difference between maintenance and prns and under what circumstances the prns are to be triggered using an action plan format that is not reflected in the computer generated alphabetically organized AVS which I have not found useful in most complex patients, especially with respiratory illnesses  Please see AVS for specific instructions unique to this visit that I personally wrote and verbalized to the the pt in detail and then reviewed with pt  by my nurse highlighting any  changes in therapy recommended at today's visit to their plan of care.            >>>  F/u  q 3 months

## 2018-05-10 NOTE — Assessment & Plan Note (Addendum)
-   01/08/2015 pfts  FEV1  1.31 (54%) s am meds and ratio 56% p 9% improvement form saba with dlco 57 to 73% p correct for alv vol > rec symb 160 2bid  - alpha one  01/08/2015 > MM - 09/22/2017  After extensive coaching inhaler device  effectiveness =    75% with hfa/ 90% with dpi  - PFT's  10/30/2017  FEV1 1.00 (43 % ) ratio 51  p 0 % improvement from saba p symbicort 160 x 2  prior to study with DLCO  39 % corrects to 52 % for alv volume  - 10/30/2017  After extensive coaching inhaler device  effectiveness =    75% > try bevespi 2bid   02/27/2018- Added Qvar 40 - changed to symb 160/spiriva  And added flutter valve - sinus CT  04/02/2018 Mild mucosal edema base of left maxillary sinus. Otherwise sinuses are clear.   Much better on lama/laba/ics despite smoking and mostly concerned about persistent cough > sob   Reviewed MC function directly related to smoking and findings on sinus ct and cxr cw just cig effects to the exclusion of other concerns and rec no change in rx/ more mucinex dm up to 2 every 12 hours and approp use of flutter valve

## 2018-05-10 NOTE — Patient Instructions (Signed)
The key is to stop smoking completely before smoking completely stops you!   No change in medications   For cough/ congestion > mucinex dm  1200 mg every 12 hours and use the flutter valve as much as you can    Please schedule a follow up visit in 3 months but call sooner if needed

## 2018-05-10 NOTE — Progress Notes (Signed)
Subjective:    Patient ID: Ann Richardson, female   DOB: [birth year]   MRN: [id number]    Brief patient profile:  88 yowf   active smoker/MM  from [place] never intense aerobic but able to do sports/ soft ball and did fine until around 2010 with doe with yardwork with assoc cough in addition has in frequency frequency episodes of bronchitis rx abx/ prednisone better then tried advair/ singulair no better so referred pulmonary clinic 11/26/2014 by  Cooper Render NP with dx GOLD II copd/ MM phenotype  01/08/15  With GOLD III criteria 10/30/2017 assoc with ongoing smoking   History of Present Illness  11/26/2014 1st  Pulmonary office visit/ [name]   Chief Complaint  Patient presents with  . Pulmonary Consult    Referred by Cooper Render, NP. Pt states she has been dxed with Bronchitis x 3 so far in 2016. She c/o cough and SOB. She gets SOB with walking up stairs. Her cough is non prod at this time. She is using albuterol inhaler 2-3 x per wk on average.   no real change symptoms on advair but hfa poor  Cough quite a bit worse in am but not productive p last round of prednisone and her goal is to prevent future flares  rec Stop advair and singulair  Plan A = automatic = symbicort 80 Take 2 puffs first thing in am and then another 2 puffs about 12 hours later.  Plan B = backup - Only use your albuterol as a rescue medication   01/08/2015 f/u ov/[name] re: copd  GOLD II/ confused with meds/ names maint vs prns  - thinks she's still on advair  Chief Complaint  Patient presents with  . office visit    pft done, inhalers are helping, breathing not bad asit was going up stairs & 1st thing in the AM   poor mdi but doing better / cough some better but still some  Min white mucus production  rec You have GOLD II severity-  moderate and will continue to progress as long as you smoke - the key is to stop smoking now  Continue symbicort  160  Take 2 puffs first thing in am and then another 2 puffs about  12 hours later.  Work on inhaler technique:       10/30/2017  f/u ov/[name] re:  GOLD III copd now on symb 160 2bid Chief Complaint  Patient presents with  . Follow-up    Feeling better ,PFT today.  Dyspnea:  Limited by L leg in splint Cough: some first thing in am > mucoid Sleep: 1 pillows  SABA use: none since started on acid suppression    rec Pantoprazole (protonix) 40 mg   Take  30-60 min before first meal of the day and Pepcid (famotidine)  20 mg one @  bedtime until return to office - this is the best way to tell whether stomach acid is contributing to your problem.   Change symbicort to  Bevespi Take 2 puffs first thing in am and then another 2 puffs about 12 hours later.  Work on inhaler technique:  The key is to stop smoking completely before smoking completely stops you!    02/06/2018  f/u ov/[name] re:  GOLD II/ still smoking  Chief Complaint  Patient presents with  . Acute Visit    Pt c/o increased SOB, cough with clear sputum and right ear pain x 6 days.    Dyspnea:  MMRC2 = can't walk  a nl pace on a flat grade s sob but does fine slow and flat  Cough: worse x 6 days/ more severe/ day and esp hs/ clear mucus  Sleeping: disturbed by cough SABA use: more since onset flare > baseline not using 02: none  rec Prednisone 10 mg take  4 each am x 2 days,   2 each am x 2 days,  1 each am x 2 days and stop Augmentin 875 mg take one pill twice daily  X 10 days - take at breakfast and supper with large glass of water.  For cough > mucinex dm up to 1200 mg every 12 hours For congestion/ ear ache>  advil cold and sinus The key is to stop smoking completely before smoking completely stops you!     03/29/2018  f/u ov/[name] re: copd GOLD II/ still smoking  Chief Complaint  Patient presents with  . Follow-up    went to UC after last visit and was dxed with bronchitis. She states still having SOB and prod cough with clear sputum.  She is using her ventolin 2 x daily on average.   75%  better while on aug/pred then gradually worse  > eval by NP  02/27/18  rec Continue Bevespi- 2 puffs twice a day Add inhaled corticosteroid - Qvar 1 puff twice a day  Back up- Albuterol rescue inhaler  Start zyrtec daily as needed  Sending in RX dymista nasal spray   Some better while on dymista  and substituted flonase/atelin and some better then a lot worse 03/10/18 and seen in UC 03/17/18 with zpak / pred > seemed some better then worse off it  rec GERDdiet Stop bevespi and qvar  Plan A = Automatic = symbicort 160 Take 2 puffs first thing in am and then another 2 puffs about 12 hours later and spiriva 2 pffs each am  Plan B = Backup Only use your albuterol as a rescue medication  Plan C = Crisis - only use your albuterol nebulizer if you first try Plan B and it fails to help  For cough/ congestion > mucinex dm  1200 mg every 12 hours and use the flutter valve as much as you can  Prednisone 10 mg Take 4 for three days 3 for three days 2 for three days 1 for three days and stop  chedule sinus CT > min edema L max Please schedule a follow up office visit in 6 weeks, call sooner if needed with all medications /inhalers/ solutions in hand    05/10/2018  f/u ov/[name] re:  GOLD II/ still smoking  Chief Complaint  Patient presents with  . Follow-up    Breathing has improved. She still gets winded walking up stairs. She is coughing with clear to yellow sputum. She has not used her albuterol inhaler recently.   Dyspnea:  MMRC1 = can walk nl pace, flat grade, can't hurry or go uphills or steps s sob   Cough: worse in am x1st 15 min => produces  one tbsp, slt discolored/ thick not using mucinex dm or flutter as rec  Sleeping: flat/ three pillow  SABA use: rarely using  02: none    No obvious day to day or daytime variability or assoc purulent sputum or mucus plugs or hemoptysis or cp or chest tightness, subjective wheeze or overt sinus or hb symptoms.   Sleeping  without nocturnal  or early am  exacerbation  of respiratory  c/o's or need for noct saba. Also  denies any obvious fluctuation of symptoms with weather or environmental changes or other aggravating or alleviating factors except as outlined above   No unusual exposure hx or h/o childhood pna/ asthma or knowledge of premature birth.  Current Allergies, Complete Past Medical History, Past Surgical History, Family History, and Social History were reviewed in [employer] record.  ROS  The following are not active complaints unless bolded Hoarseness, sore throat, dysphagia, dental problems, itching, sneezing,  nasal congestion or discharge of excess mucus or purulent secretions, ear ache,   fever, chills, sweats, unintended wt loss or wt gain, classically pleuritic or exertional cp,  orthopnea pnd or arm/hand swelling  or leg swelling, presyncope, palpitations, abdominal pain, anorexia, nausea, vomiting, diarrhea  or change in bowel habits or change in bladder habits, change in stools or change in urine, dysuria, hematuria,  rash, arthralgias, visual complaints, headache, numbness, weakness or ataxia or problems with walking or coordination,  change in mood or  memory.        Current Meds  Medication Sig  . albuterol (PROVENTIL HFA;VENTOLIN HFA) 108 (90 BASE) MCG/ACT inhaler Inhale 2 puffs into the lungs every 6 (six) hours as needed for wheezing or shortness of breath.  . ALPRAZolam (XANAX) 1 MG tablet Take 1 mg by mouth at bedtime as needed.   . Ascorbic Acid (VITAMIN C) 1000 MG tablet Take by mouth.  Marland Kitchen atorvastatin (LIPITOR) 10 MG tablet Take 5 mg by mouth at bedtime.   . budesonide-formoterol (SYMBICORT) 160-4.5 MCG/ACT inhaler Inhale 2 puffs into the lungs 2 (two) times daily.  . calcium-vitamin D (OSCAL WITH D) 500-200 MG-UNIT tablet Take 1 tablet by mouth daily.  . cholecalciferol (VITAMIN D) 1000 units tablet Take 1,000 Units by mouth daily.  . Coenzyme Q10 100 MG capsule   . famotidine (PEPCID) 20 MG  tablet One at bedtime  . pantoprazole (PROTONIX) 40 MG tablet TAKE 1 TABLET BY MOUTH ONCE DAILY. TAKE 30 TO 60 MINUTES BEFORE FIRST MEAL OF THE DAY  . Respiratory Therapy Supplies (FLUTTER) DEVI Use as directed  . Tiotropium Bromide Monohydrate (SPIRIVA RESPIMAT) 2.5 MCG/ACT AERS Inhale 2 puffs into the lungs daily.                 Objective:   Physical Exam   amb wf nad / smoker's rattle of voluntary cough - less than prior    05/10/2018      125 03/29/2018        122  02/06/2018       123 10/30/2017         127  09/22/2017       127   06/06/2016    130  04/10/15 141 lb 6.4 oz (64.139 kg)  01/08/15 140 lb (63.504 kg)  11/26/14 137 lb (62.143 kg)      Vital signs reviewed - Note on arrival 02 sats  97% on RA         HEENT: nl dentition / oropharynx. Nl external ear canals without cough reflex -  Mild bilateral non-specific turbinate edema     NECK :  without JVD/Nodes/TM/ nl carotid upstrokes bilaterally   LUNGS: no acc muscle use,  Mild barrel  contour chest wall with bilateral  Distant bs s audible wheeze and  without cough on insp or exp maneuver and mild  Hyperresonant  to  percussion bilaterally     CV:  RRR  no s3 or murmur or increase in P2, and no edema  ABD:  soft and nontender with pos end  insp Hoover's  in the supine position. No bruits or organomegaly appreciated, bowel sounds nl  MS:   Nl gait/  ext warm without deformities, calf tenderness, cyanosis or clubbing No obvious joint restrictions   SKIN: warm and dry without lesions    NEURO:  alert, approp, nl sensorium with  no motor or cerebellar deficits apparent.         Assessment:

## 2018-05-30 ENCOUNTER — Other Ambulatory Visit: Payer: Self-pay | Admitting: Internal Medicine

## 2018-06-01 ENCOUNTER — Ambulatory Visit
Admission: RE | Admit: 2018-06-01 | Discharge: 2018-06-01 | Disposition: A | Discharge status: Home / Self Care | Payer: 59 | Source: Ambulatory Visit | Attending: Physician Assistant | Admitting: Physician Assistant

## 2018-06-01 ENCOUNTER — Other Ambulatory Visit: Payer: Self-pay | Admitting: Physician Assistant

## 2018-06-01 DIAGNOSIS — M25552 Pain in left hip: Secondary | ICD-10-CM

## 2018-06-01 DIAGNOSIS — M25562 Pain in left knee: Secondary | ICD-10-CM

## 2018-08-01 NOTE — Telephone Encounter (Signed)
Contacted [facility] and requested that PA request be faxed to our office for Spiriva. Will leave message in triage until request is received.

## 2018-08-02 NOTE — Telephone Encounter (Signed)
I have checked PA folder. It does not appear that PA request has bene received as of yet. I have contacted [facility] and requested that PA request be re faxed to our office.

## 2018-08-03 NOTE — Telephone Encounter (Signed)
Routing to [place] per request, as she is working on [employer] today.

## 2018-08-03 NOTE — Telephone Encounter (Signed)
Found PA request via [email or web address]  Medication name and strength: Spiriva 2.79mcg  Provider: [facility]: [facility] Patient insurance ID:    Was the PA started on CMM?  Yes If yes, please enter the Key: [id number] Timeframe for approval/denial: instant

## 2018-08-09 ENCOUNTER — Ambulatory Visit: Payer: 59 | Admitting: Internal Medicine

## 2018-08-10 ENCOUNTER — Ambulatory Visit (INDEPENDENT_AMBULATORY_CARE_PROVIDER_SITE_OTHER): Payer: 59 | Admitting: Internal Medicine

## 2018-08-10 ENCOUNTER — Telehealth: Payer: Self-pay | Admitting: General Surgery

## 2018-08-10 ENCOUNTER — Encounter: Payer: Self-pay | Admitting: Internal Medicine

## 2018-08-10 VITALS — BP 155/88 | HR 114 | Ht 61.0 in | Wt 131.4 lb

## 2018-08-10 DIAGNOSIS — F17210 Nicotine dependence, cigarettes, uncomplicated: Secondary | ICD-10-CM | POA: Diagnosis not present

## 2018-08-10 DIAGNOSIS — R05 Cough: Secondary | ICD-10-CM

## 2018-08-10 DIAGNOSIS — J449 Chronic obstructive pulmonary disease, unspecified: Secondary | ICD-10-CM

## 2018-08-10 DIAGNOSIS — R058 Other specified cough: Secondary | ICD-10-CM

## 2018-08-10 MED ORDER — TIOTROPIUM BROMIDE MONOHYDRATE 2.5 MCG/ACT IN AERS: 2.0000 | INHALATION_SPRAY | Freq: Every day | RESPIRATORY_TRACT | 0 refills | Status: DC

## 2018-08-10 NOTE — Telephone Encounter (Signed)
Confirmed Key # [id number] was approved.  Called [facility] and confirmed the patient has not picked up the Spiriva as the copay is over $300. Pharmacy stated that may be due to deductible and price may go down after that payment has been made. Called [facility] [phone] Spoke with Ron who stated the covered alternative Incruise Ellipta 62.5 mcg.   Dr. Sherene Sires, based on the above information, can this patient be given the alternative of Incruise?

## 2018-08-10 NOTE — Telephone Encounter (Signed)
Discussed with pt at [facility] 08/10/2018

## 2018-08-10 NOTE — Progress Notes (Signed)
Subjective:    Patient ID: Ann Richardson, female   DOB: [birth year]   MRN: [id number]    Brief patient profile:  88 yowf   active smoker/MM  from [place] never intense aerobic but able to do sports/ soft ball and did fine until around 2010 with doe with yardwork with assoc cough in addition has in frequency frequency episodes of bronchitis rx abx/ prednisone better then tried advair/ singulair no better so referred pulmonary clinic 11/26/2014 by  Cooper Render NP with dx GOLD II copd/ MM phenotype  01/08/15  With GOLD III criteria 10/30/2017 assoc with ongoing smoking   History of Present Illness  11/26/2014 1st  Pulmonary office visit/ Marielle Mantione   Chief Complaint  Patient presents with  . Pulmonary Consult    Referred by Cooper Render, NP. Pt states she has been dxed with Bronchitis x 3 so far in 2016. She c/o cough and SOB. She gets SOB with walking up stairs. Her cough is non prod at this time. She is using albuterol inhaler 2-3 x per wk on average.   no real change symptoms on advair but hfa poor  Cough quite a bit worse in am but not productive p last round of prednisone and her goal is to prevent future flares  rec Stop advair and singulair  Plan A = automatic = symbicort 80 Take 2 puffs first thing in am and then another 2 puffs about 12 hours later.  Plan B = backup - Only use your albuterol as a rescue medication   01/08/2015 f/u ov/Linnet Bottari re: copd  GOLD II/ confused with meds/ names maint vs prns  - thinks she's still on advair  Chief Complaint  Patient presents with  . office visit    pft done, inhalers are helping, breathing not bad asit was going up stairs & 1st thing in the AM   poor mdi but doing better / cough some better but still some  Min white mucus production  rec You have GOLD II severity-  moderate and will continue to progress as long as you smoke - the key is to stop smoking now  Continue symbicort  160  Take 2 puffs first thing in am and then another 2 puffs about  12 hours later.  Work on inhaler technique:       10/30/2017  f/u ov/Taygen Acklin re:  GOLD III copd now on symb 160 2bid Chief Complaint  Patient presents with  . Follow-up    Feeling better ,PFT today.  Dyspnea:  Limited by L leg in splint Cough: some first thing in am > mucoid Sleep: 1 pillows  SABA use: none since started on acid suppression    rec Pantoprazole (protonix) 40 mg   Take  30-60 min before first meal of the day and Pepcid (famotidine)  20 mg one @  bedtime until return to office - this is the best way to tell whether stomach acid is contributing to your problem.   Change symbicort to  Bevespi Take 2 puffs first thing in am and then another 2 puffs about 12 hours later.  Work on inhaler technique:  The key is to stop smoking completely before smoking completely stops you!    02/06/2018  f/u ov/Laquanda Bick re:  GOLD II/ still smoking  Chief Complaint  Patient presents with  . Acute Visit    Pt c/o increased SOB, cough with clear sputum and right ear pain x 6 days.    Dyspnea:  MMRC2 = can't walk  a nl pace on a flat grade s sob but does fine slow and flat  Cough: worse x 6 days/ more severe/ day and esp hs/ clear mucus  Sleeping: disturbed by cough SABA use: more since onset flare > baseline not using 02: none  rec Prednisone 10 mg take  4 each am x 2 days,   2 each am x 2 days,  1 each am x 2 days and stop Augmentin 875 mg take one pill twice daily  X 10 days - take at breakfast and supper with large glass of water.  For cough > mucinex dm up to 1200 mg every 12 hours For congestion/ ear ache>  advil cold and sinus The key is to stop smoking completely before smoking completely stops you!     03/29/2018  f/u ov/Hillarie Harrigan re: copd GOLD II/ still smoking  Chief Complaint  Patient presents with  . Follow-up    went to UC after last visit and was dxed with bronchitis. She states still having SOB and prod cough with clear sputum.  She is using her ventolin 2 x daily on average.   75%  better while on aug/pred then gradually worse  > eval by NP  02/27/18  rec Continue Bevespi- 2 puffs twice a day Add inhaled corticosteroid - Qvar 1 puff twice a day  Back up- Albuterol rescue inhaler  Start zyrtec daily as needed  Sending in RX dymista nasal spray   Some better while on dymista  and substituted flonase/atelin and some better then a lot worse 03/10/18 and seen in UC 03/17/18 with zpak / pred > seemed some better then worse off it  rec GERDdiet Stop bevespi and qvar  Plan A = Automatic = symbicort 160 Take 2 puffs first thing in am and then another 2 puffs about 12 hours later and spiriva 2 pffs each am  Plan B = Backup Only use your albuterol as a rescue medication  Plan C = Crisis - only use your albuterol nebulizer if you first try Plan B and it fails to help  For cough/ congestion > mucinex dm  1200 mg every 12 hours and use the flutter valve as much as you can  Prednisone 10 mg Take 4 for three days 3 for three days 2 for three days 1 for three days and stop  chedule sinus CT > min edema L max Please schedule a follow up office visit in 6 weeks, call sooner if needed with all medications /inhalers/ solutions in hand    05/10/2018  f/u ov/Armstead Heiland re:  GOLD II/ still smoking  Chief Complaint  Patient presents with  . Follow-up    Breathing has improved. She still gets winded walking up stairs. She is coughing with clear to yellow sputum. She has not used her albuterol inhaler recently.   Dyspnea:  MMRC1 = can walk nl pace, flat grade, can't hurry or go uphills or steps s sob   Cough: worse in am x1st 15 min => produces  one tbsp, slt discolored/ thick not using mucinex dm or flutter as rec  Sleeping: flat/ three pillow  SABA use: rarely using  rec The key is to stop smoking completely before smoking completely stops you!  No change in medications  For cough/ congestion > mucinex dm  1200 mg every 12 hours and use the flutter valve as much as you can    08/10/2018  f/u  ov/Teagon Kron re: GOLD II  Active smoker on chantix /  symb 160 / no longer on spiriva "didn't help"  Chief Complaint  Patient presents with  . Follow-up     SOB -  She is down to 2 cigs per day.   Dyspnea:  MMRC1 = can walk nl pace, flat grade, can't hurry or go uphills or steps s sob  - worse off spiriva  Cough: no production  Sleeping: flat bed/ 2-3 pillows  SABA use: 2 x weekly  02: none   No obvious day to day or daytime variability or assoc excess/ purulent sputum or mucus plugs or hemoptysis or cp or chest tightness, subjective wheeze or overt sinus or hb symptoms.   Sleeping s above without nocturnal  or early am exacerbation  of respiratory  c/o's or need for noct saba. Also denies any obvious fluctuation of symptoms with weather or environmental changes or other aggravating or alleviating factors except as outlined above   No unusual exposure hx or h/o childhood pna/ asthma or knowledge of premature birth.  Current Allergies, Complete Past Medical History, Past Surgical History, Family History, and Social History were reviewed in [employer] record.  ROS  The following are not active complaints unless bolded Hoarseness, sore throat, dysphagia, dental problems, itching, sneezing,  nasal congestion or discharge of excess mucus or purulent secretions, ear ache,   fever, chills, sweats, unintended wt loss or wt gain, classically pleuritic or exertional cp,  orthopnea pnd or arm/hand swelling  or leg swelling, presyncope, palpitations, abdominal pain, anorexia, nausea, vomiting, diarrhea  or change in bowel habits or change in bladder habits, change in stools or change in urine, dysuria, hematuria,  rash, arthralgias, visual complaints, headache, numbness, weakness or ataxia or problems with walking or coordination,  change in mood or  memory.        Current Meds  Medication Sig  . albuterol (PROVENTIL HFA;VENTOLIN HFA) 108 (90 BASE) MCG/ACT inhaler Inhale 2 puffs into  the lungs every 6 (six) hours as needed for wheezing or shortness of breath.  . ALPRAZolam (XANAX) 1 MG tablet Take 1 mg by mouth at bedtime as needed.   . Ascorbic Acid (VITAMIN C) 1000 MG tablet Take 1,000 mg by mouth daily.   Marland Kitchen atorvastatin (LIPITOR) 10 MG tablet Take 5 mg by mouth at bedtime.   . budesonide-formoterol (SYMBICORT) 160-4.5 MCG/ACT inhaler Inhale 2 puffs into the lungs 2 (two) times daily.  . calcium-vitamin D (OSCAL WITH D) 500-200 MG-UNIT tablet Take 1 tablet by mouth daily.  . cholecalciferol (VITAMIN D) 1000 units tablet Take 1,000 Units by mouth daily.  . Coenzyme Q10 100 MG capsule Take 100 mg by mouth every 7 (seven) days.   . famotidine (PEPCID) 20 MG tablet One at bedtime  . pantoprazole (PROTONIX) 40 MG tablet TAKE 1 TABLET BY MOUTH ONCE DAILY. TAKE 30-60 MINUTES BEFORE FIRST MEAL OF THE DAY  . Respiratory Therapy Supplies (FLUTTER) DEVI Use as directed  . varenicline (CHANTIX) 1 MG tablet Take 1 mg by mouth 2 (two) times daily.                Objective:   Physical Exam   amb wf/ minimal cough /rattle    08/10/2018        131  05/10/2018      125 03/29/2018        122  02/06/2018       123 10/30/2017         127  09/22/2017  127   06/06/2016    130  04/10/15 141 lb 6.4 oz (64.139 kg)  01/08/15 140 lb (63.504 kg)  11/26/14 137 lb (62.143 kg)    Vital signs reviewed - Note on arrival 02 sats  94% on RA       HEENT: nl dentition / oropharynx. Nl external ear canals without cough reflex -  Mild bilateral non-specific turbinate edema     NECK :  without JVD/Nodes/TM/ nl carotid upstrokes bilaterally   LUNGS: no acc muscle use,  Mild barrel  contour chest wall with bilateral  Distant bs s audible wheeze and  without cough on insp or exp maneuver and mild  Hyperresonant  to  percussion bilaterally     CV:  RRR  no s3 or murmur or increase in P2, and no edema   ABD:  soft and nontender with pos mid insp Hoover's  in the supine position. No bruits or  organomegaly appreciated, bowel sounds nl  MS:   Nl gait/  ext warm without deformities, calf tenderness, cyanosis or clubbing No obvious joint restrictions   SKIN: warm and dry without lesions    NEURO:  alert, approp, nl sensorium with  no motor or cerebellar deficits apparent.            Assessment:

## 2018-08-10 NOTE — Patient Instructions (Addendum)
The key is to stop smoking completely before smoking completely stops you!    For cough/ congestion > mucinex dm  1200 mg every 12 hours and use the flutter valve as much as you can   Try off protonix and take pepcid 20mg  twice daily taken after breakfast and supper x 2 weeks then just take the nigthtime pill.   Please schedule a follow up visit in 6 months but call sooner if needed

## 2018-08-13 ENCOUNTER — Encounter: Payer: Self-pay | Admitting: Internal Medicine

## 2018-08-13 NOTE — Assessment & Plan Note (Signed)
Counseled re importance of smoking cessation but did not meet time criteria for separate billing    Very close to quitting at this point, on chantix > rec full cessation if possible and f/u per PCP   I had an extended discussion with the patient reviewing all relevant studies completed to date and  lasting 15 to 20 minutes of a 25 minute visit    See device teaching which extended face to face time for this visit.  Each maintenance medication was reviewed in detail including emphasizing most importantly the difference between maintenance and prns and under what circumstances the prns are to be triggered using an action plan format that is not reflected in the computer generated alphabetically organized AVS which I have not found useful in most complex patients, especially with respiratory illnesses  Please see AVS for specific instructions unique to this visit that I personally wrote and verbalized to the the pt in detail and then reviewed with pt  by my nurse highlighting any  changes in therapy recommended at today's visit to their plan of care.

## 2018-08-13 NOTE — Assessment & Plan Note (Addendum)
Active smoker    - 01/08/2015 pfts  FEV1  1.31 (54%) s am meds and ratio 56% p 9% improvement form saba with dlco 57 to 73% p correct for alv vol > rec symb 160 2bid  - alpha one  01/08/2015 > MM - PFT's  10/30/2017  FEV1 1.00 (43 % ) ratio 51  p 0 % improvement from saba p symbicort 160 x 2  prior to study with DLCO  39 % corrects to 52 % for alv volume  - 10/30/2017  After extensive coaching inhaler device  effectiveness =    75% > try bevespi 2bid   02/27/2018- Added Qvar 40 03/29/18  changed to symb 160/spiriva  And added flutter valve - sinus CT  04/02/2018 Mild mucosal edema base of left maxillary sinus. Otherwise sinuses are clear   - 08/10/2018  After extensive coaching inhaler device,  effectiveness =    90%   Overall doing better    Advised:  formulary restrictions will be an ongoing challenge for the forseable future and I would be happy to pick an alternative if the pt will first  provide me a list of them -  pt  will need to return here for training for any new device that is required eg dpi vs hfa vs respimat.    In the meantime we can always provide samples so that the patient never runs out of any needed respiratory medications.

## 2018-08-13 NOTE — Assessment & Plan Note (Addendum)
-   sinus CT  04/02/2018 Mild mucosal edema base of left maxillary sinus. Otherwise sinuses are clear  Improved with decreases cigs Ok to try off gerd rx and see if cough recurs  Discussed the recent press about ppi's in the context of a statistically significant (but questionably clinically relevant) increase in CRI in pts on ppi vs h2's > bottom line is the lowest dose of ppi that controls   gerd is the right dose and if that dose is zero that's fine esp since h2's are cheaper.       NB the  ramp to expected improvement in symptoms from an empiric trial of PPI (and for that matter, worsening, if a chronic effective medication is stopped)  can be measured in weeks, not days, a common misconception because this is not the same as treating heartburn (no immediate cause and effect relationship)  so that response to therapy or lack thereof can be very difficult to assess especially if the patient is not adherent to the treatment plan which includes dietary restrictions.     I had an extended discussion with the patient reviewing all relevant studies completed to date and  lasting 15 to 20 minutes of a 25 minute visit    See device teaching which extended face to face time for this visit.  Each maintenance medication was reviewed in detail including emphasizing most importantly the difference between maintenance and prns and under what circumstances the prns are to be triggered using an action plan format that is not reflected in the computer generated alphabetically organized AVS which I have not found useful in most complex patients, especially with respiratory illnesses  Please see AVS for specific instructions unique to this visit that I personally wrote and verbalized to the the pt in detail and then reviewed with pt  by my nurse highlighting any  changes in therapy recommended at today's visit to their plan of care.

## 2018-10-15 NOTE — Telephone Encounter (Signed)
That's fine : letter should say "due to severe copd until such time that we can guarantee a safe work environment for this patient I have advised she work from home."

## 2018-10-15 NOTE — Telephone Encounter (Signed)
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   Letter states that she works for a [employer] that is planning on reopening on May 4th. They have 20 people in one room, all working less than 64ft apart. She is concerned about the lack of social distancing. If they hire more people, she will have person less than 2 ft away from her desk. So far she has been working from home and limiting her contact from outside. She has a history of COPD.   She wants to know if she could have a letter to state that despite the company opening its door to employees on May 4th, that she should continue to work from home until further notice.   MW, please advise if you are ok with such a letter. Thanks!

## 2018-12-27 IMAGING — CT CT PARANASAL SINUSES LIMITED
1 of 2 series · 8 of 11 positions shown, 10 images · non-contrast
Comparison: None.

CLINICAL DATA: Upper airway cough syndrome

EXAM:
CT PARANASAL SINUS LIMITED WITHOUT CONTRAST
TECHNIQUE: Non-contiguous multidetector CT images of the paranasal sinuses were
obtained in a single plane without contrast.

[Series 4: limited sinus st · axial · 0.23mm/px · z∈[+155,+225]mm · 8 of 10 slices shown, 10 images]
[im 2/10  brain]
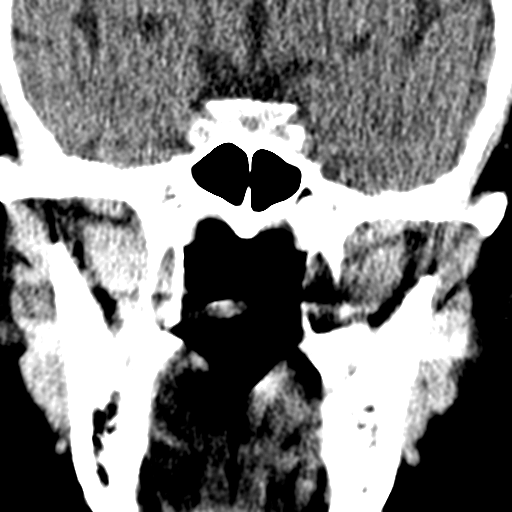
[im 2/10  bone]
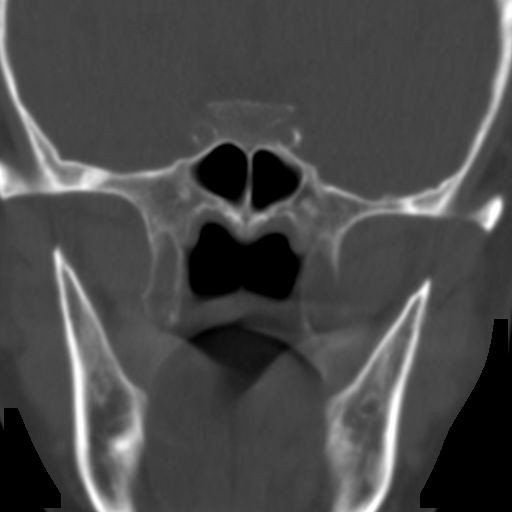
[im 3/10  bone]
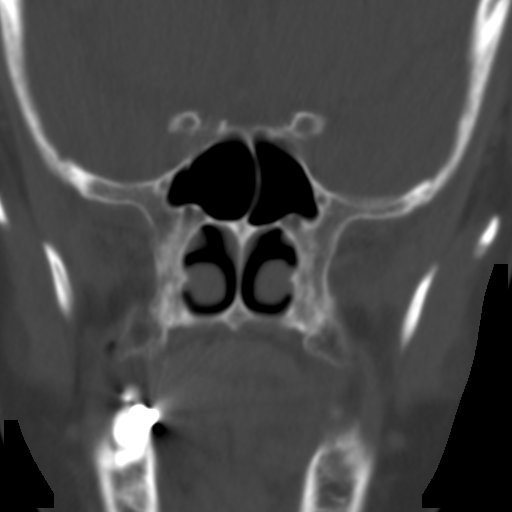
[im 4/10  bone]
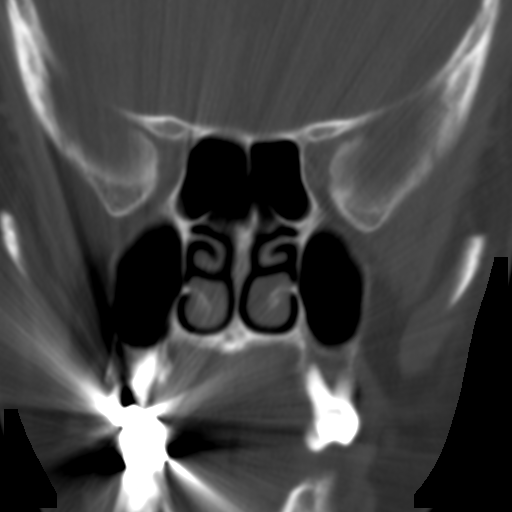
[im 5/10  bone]
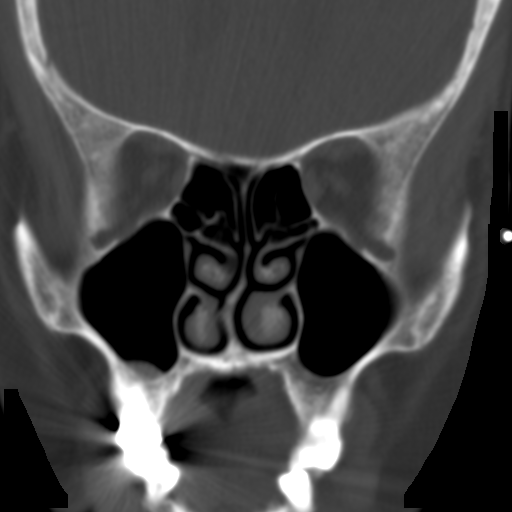
[im 6/10  brain]
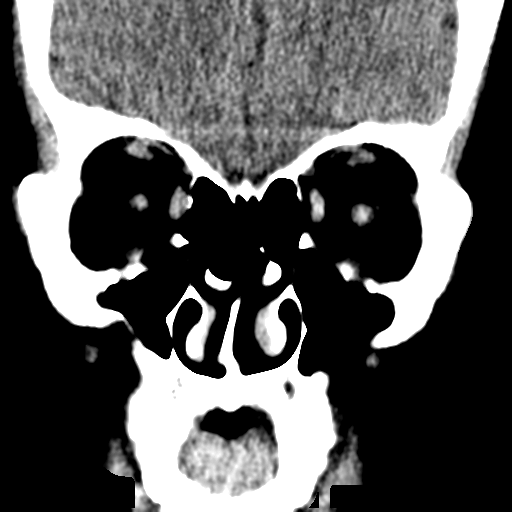
[im 6/10  bone]
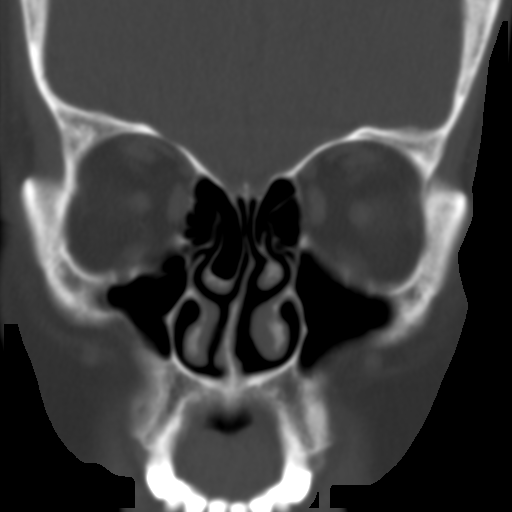
[im 7/10  bone]
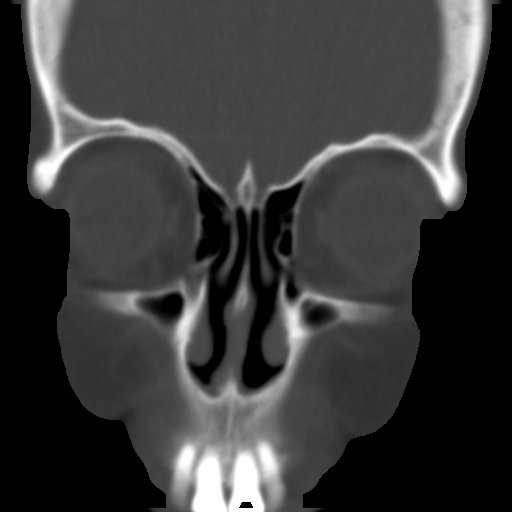
[im 8/10  bone]
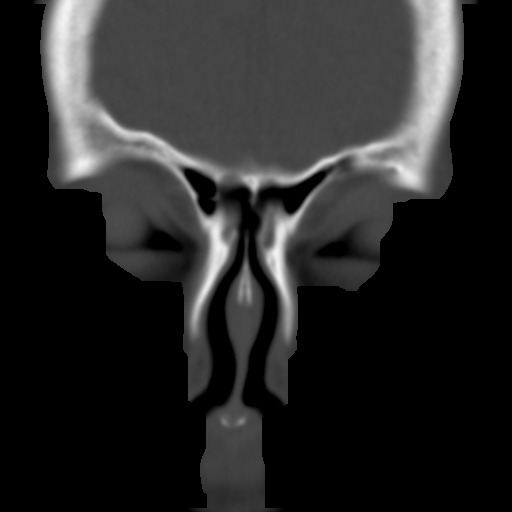
[im 9/10  bone]
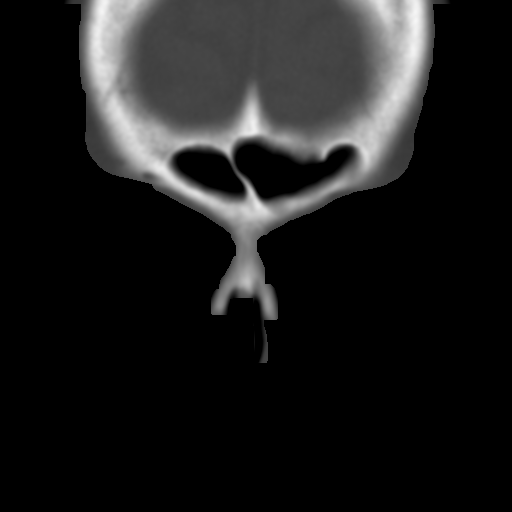

[8 of 11 positions shown; findings below may reference images not displayed]

FINDINGS: Paranasal sinuses are well aerated and clear. Minimal mucosal edema
base of left maxillary sinus otherwise no mucosal edema or air-fluid
level.

Nasal septum deviated to the left. Concha bullosa left middle
turbinate. No acute bony abnormality.
IMPRESSION: Mild mucosal edema base of left maxillary sinus. Otherwise sinuses
are clear.

## 2019-02-08 ENCOUNTER — Ambulatory Visit: Payer: 59 | Admitting: Internal Medicine

## 2019-02-11 ENCOUNTER — Encounter: Payer: Self-pay | Admitting: Internal Medicine

## 2019-02-11 ENCOUNTER — Other Ambulatory Visit: Payer: Self-pay

## 2019-02-11 ENCOUNTER — Ambulatory Visit (INDEPENDENT_AMBULATORY_CARE_PROVIDER_SITE_OTHER): Payer: 59 | Admitting: Internal Medicine

## 2019-02-11 DIAGNOSIS — F17210 Nicotine dependence, cigarettes, uncomplicated: Secondary | ICD-10-CM

## 2019-02-11 DIAGNOSIS — J449 Chronic obstructive pulmonary disease, unspecified: Secondary | ICD-10-CM

## 2019-02-11 NOTE — Assessment & Plan Note (Signed)
Active smoker    - 01/08/2015 pfts  FEV1  1.31 (54%) s am meds and ratio 56% p 9% improvement form saba with dlco 57 to 73% p correct for alv vol > rec symb 160 2bid  - alpha one  01/08/2015 > MM - PFT's  10/30/2017  FEV1 1.00 (43 % ) ratio 51  p 0 % improvement from saba p symbicort 160 x 2  prior to study with DLCO  39 % corrects to 52 % for alv volume  - 10/30/2017  After extensive coaching inhaler device  effectiveness =    75% > try bevespi 2bid   02/27/2018- Added Qvar 40 03/29/18  changed to symb 160/spiriva  And added flutter valve - 08/10/2018  After extensive coaching inhaler device,  effectiveness =    90% with smi > resume spiriva if can access it  Adequate control on present rx, reviewed in detail with pt > no change in rx needed

## 2019-02-11 NOTE — Progress Notes (Signed)
Subjective:    Patient ID: Ann Richardson, female   DOB: 06/21/1961   MRN: [id number]    Brief patient profile:  60 yowf   active smoker/MM  from [place] never intense aerobic but able to do sports/ soft ball and did fine until around 2010 with doe with yardwork with assoc cough in addition has in frequency frequency episodes of bronchitis rx abx/ prednisone better then tried advair/ singulair no better so referred pulmonary clinic 11/26/2014 by  Nena Polio NP with dx GOLD II copd/ MM phenotype  01/08/15  With GOLD III criteria 10/30/2017 assoc with ongoing smoking   History of Present Illness  11/26/2014 1st [facility] office visit/ Aftin Lye   Chief Complaint  Patient presents with  . Pulmonary Consult    Referred by Nena Polio, NP. Pt states she has been dxed with Bronchitis x 3 so far in 2016. She c/o cough and SOB. She gets SOB with walking up stairs. Her cough is non prod at this time. She is using albuterol inhaler 2-3 x per wk on average.   no real change symptoms on advair but hfa poor  Cough quite a bit worse in am but not productive p last round of prednisone and her goal is to prevent future flares  rec Stop advair and singulair  Plan A = automatic = symbicort 80 Take 2 puffs first thing in am and then another 2 puffs about 12 hours later.  Plan B = backup - Only use your albuterol as a rescue medication   01/08/2015 f/u ov/Havier Deeb re: copd  GOLD II/ confused with meds/ names maint vs prns  - thinks she's still on advair  Chief Complaint  Patient presents with  . office visit    pft done, inhalers are helping, breathing not bad asit was going up stairs & 1st thing in the AM   poor mdi but doing better / cough some better but still some  Min white mucus production  rec You have GOLD II severity-  moderate and will continue to progress as long as you smoke - the key is to stop smoking now  Continue symbicort  160  Take 2 puffs first thing in am and then another 2 puffs about  12 hours later.  Work on inhaler technique:       10/30/2017  f/u ov/Tacori Kvamme re:  GOLD III copd now on symb 160 2bid Chief Complaint  Patient presents with  . Follow-up    Feeling better ,PFT today.  Dyspnea:  Limited by L leg in splint Cough: some first thing in am > mucoid Sleep: 1 pillows  SABA use: none since started on acid suppression    rec Pantoprazole (protonix) 40 mg   Take  30-60 min before first meal of the day and Pepcid (famotidine)  20 mg one @  bedtime until return to office - this is the best way to tell whether stomach acid is contributing to your problem.   Change symbicort to  Bevespi Take 2 puffs first thing in am and then another 2 puffs about 12 hours later.  Work on inhaler technique:  The key is to stop smoking completely before smoking completely stops you!    02/06/2018  f/u ov/Emanuelle Bastos re:  GOLD II/ still smoking  Chief Complaint  Patient presents with  . Acute Visit    Pt c/o increased SOB, cough with clear sputum and right ear pain x 6 days.    Dyspnea:  MMRC2 = can't walk  a nl pace on a flat grade s sob but does fine slow and flat  Cough: worse x 6 days/ more severe/ day and esp hs/ clear mucus  Sleeping: disturbed by cough SABA use: more since onset flare > baseline not using 02: none  rec Prednisone 10 mg take  4 each am x 2 days,   2 each am x 2 days,  1 each am x 2 days and stop Augmentin 875 mg take one pill twice daily  X 10 days - take at breakfast and supper with large glass of water.  For cough > mucinex dm up to 1200 mg every 12 hours For congestion/ ear ache>  advil cold and sinus The key is to stop smoking completely before smoking completely stops you!     03/29/2018  f/u ov/Stephanie Littman re: copd GOLD II/ still smoking  Chief Complaint  Patient presents with  . Follow-up    went to UC after last visit and was dxed with bronchitis. She states still having SOB and prod cough with clear sputum.  She is using her ventolin 2 x daily on average.   75%  better while on aug/pred then gradually worse  > eval by NP  02/27/18  rec Continue Bevespi- 2 puffs twice a day Add inhaled corticosteroid - Qvar 1 puff twice a day  Back up- Albuterol rescue inhaler  Start zyrtec daily as needed  Sending in RX dymista nasal spray   Some better while on dymista  and substituted flonase/atelin and some better then a lot worse 03/10/18 and seen in UC 03/17/18 with zpak / pred > seemed some better then worse off it  rec GERDdiet Stop bevespi and qvar  Plan A = Automatic = symbicort 160 Take 2 puffs first thing in am and then another 2 puffs about 12 hours later and spiriva 2 pffs each am  Plan B = Backup Only use your albuterol as a rescue medication  Plan C = Crisis - only use your albuterol nebulizer if you first try Plan B and it fails to help  For cough/ congestion > mucinex dm  1200 mg every 12 hours and use the flutter valve as much as you can  Prednisone 10 mg Take 4 for three days 3 for three days 2 for three days 1 for three days and stop  chedule sinus CT > min edema L max Please schedule a follow up office visit in 6 weeks, call sooner if needed with all medications /inhalers/ solutions in hand    05/10/2018  f/u ov/Korene Dula re:  GOLD II/ still smoking  Chief Complaint  Patient presents with  . Follow-up    Breathing has improved. She still gets winded walking up stairs. She is coughing with clear to yellow sputum. She has not used her albuterol inhaler recently.   Dyspnea:  MMRC1 = can walk nl pace, flat grade, can't hurry or go uphills or steps s sob   Cough: worse in am x1st 15 min => produces  one tbsp, slt discolored/ thick not using mucinex dm or flutter as rec  Sleeping: flat/ three pillow  SABA use: rarely using  rec The key is to stop smoking completely before smoking completely stops you!  No change in medications  For cough/ congestion > mucinex dm  1200 mg every 12 hours and use the flutter valve as much as you can    08/10/2018  f/u  ov/Lanelle Lindo re: GOLD II  Active smoker on chantix /  symb 160 / no longer on spiriva "didn't help"  Chief Complaint  Patient presents with  . Follow-up     SOB -  She is down to 2 cigs per day.   Dyspnea:  MMRC1 = can walk nl pace, flat grade, can't hurry or go uphills or steps s sob  - worse off spiriva  Cough: no production  Sleeping: flat bed/ 2-3 pillows  SABA use: 2 x weekly  02: none rec The key is to stop smoking completely before smoking completely stops you!   For cough/ congestion > mucinex dm  1200 mg every 12 hours and use the flutter valve as much as you can  Try off protonix and take pepcid 20mg  twice daily taken after breakfast and supper x 2 weeks then just take the nigthtime pill.   02/11/2019  f/u ov/Jaiquan Temme re:  GOLD II maint symb/ spriva/ still smoking but down to 4-5 per day on chantix  Chief Complaint  Patient presents with  . Follow-up    Patient reports that her breathing is doing well. She reports using her rescue inhaler with extertion. She also reports a cough when she gets too hot.  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   Sleeping  without nocturnal   exacerbation  of respiratory  c/o's or need for noct saba. Also denies any obvious fluctuation of symptoms with weather or environmental changes or other aggravating or alleviating factors except as outlined above   No unusual exposure hx or h/o childhood pna/ asthma or knowledge of premature birth.  Current Allergies, Complete Past Medical History,  Past Surgical History, Family History, and Social History were reviewed in [facility] electronic medical record.  ROS  The following are not active complaints unless bolded Hoarseness, sore throat, dysphagia, dental problems, itching, sneezing,  nasal congestion or discharge of excess mucus or purulent secretions, ear ache,   fever, chills, sweats, unintended wt loss or wt gain, classically pleuritic or exertional cp,  orthopnea pnd or arm/hand swelling  or leg swelling, presyncope, palpitations, abdominal pain, anorexia, nausea, vomiting, diarrhea  or change in bowel habits or change in bladder habits, change in stools or change in urine, dysuria, hematuria,  rash, arthralgias, visual complaints, headache, numbness, weakness or ataxia or problems with walking or coordination,  change in mood or  memory.        Current Meds  Medication Sig  . albuterol (PROVENTIL HFA;VENTOLIN HFA) 108 (90 BASE) MCG/ACT inhaler Inhale 2 puffs into the lungs every 6 (six) hours as needed for wheezing or shortness of breath.  . ALPRAZolam (XANAX) 1 MG tablet Take 1 mg by mouth at bedtime as needed.   . Ascorbic Acid (VITAMIN C) 1000 MG tablet Take 1,000 mg by mouth daily.   Marland Kitchen. atorvastatin (LIPITOR) 10 MG tablet Take 5 mg by mouth at bedtime.   . budesonide-formoterol (SYMBICORT) 160-4.5 MCG/ACT inhaler Inhale 2 puffs into the lungs 2 (  two) times daily.  . calcium-vitamin D (OSCAL WITH D) 500-200 MG-UNIT tablet Take 1 tablet by mouth daily.  . cholecalciferol (VITAMIN D) 1000 units tablet Take 1,000 Units by mouth daily.  Marland Kitchen. Respiratory Therapy Supplies (FLUTTER) DEVI Use as directed  . Tiotropium Bromide Monohydrate (SPIRIVA RESPIMAT) 2.5 MCG/ACT AERS Inhale 2 puffs into the lungs daily.  . varenicline (CHANTIX) 1 MG tablet Take 1 mg by mouth 2 (two) times daily.                       Objective:   Physical Exam     02/11/2019          08/10/2018        131  05/10/2018      125 03/29/2018        122   02/06/2018       123 10/30/2017         127  09/22/2017       127   06/06/2016    130  04/10/15 141 lb 6.4 oz (64.139 kg)  01/08/15 140 lb (63.504 kg)  11/26/14 137 lb (62.143 kg)    Pleasant amb wf nad   Vital signs reviewed - Note on arrival 02 sats  97% on RA   HEENT: nl dentition / oropharynx. Nl external ear canals without cough reflex -  Mild bilateral non-specific turbinate edema     NECK :  without JVD/Nodes/TM/ nl carotid upstrokes bilaterally   LUNGS: no acc muscle use,  Mild barrel  contour chest wall with bilateral  Distant bs s audible wheeze and  without cough on insp or exp maneuver and mild  Hyperresonant  to  percussion bilaterally     CV:  RRR  no s3 or murmur or increase in P2, and no edema   ABD:  soft and nontender with pos end  insp Hoover's  in the supine position. No bruits or organomegaly appreciated, bowel sounds nl  MS:   Nl gait/  ext warm without deformities, calf tenderness, cyanosis or clubbing No obvious joint restrictions   SKIN: warm and dry without lesions    NEURO:  alert, approp, nl sensorium with  no motor or cerebellar deficits apparent.                Assessment:

## 2019-02-11 NOTE — Assessment & Plan Note (Signed)
Chantix rx 04/10/2015 and again started 01/2019   Counseled re importance of smoking cessation but did not meet time criteria for separate billing  - plans to quit after Labor day - in meantime needs to uncouple triggers and smoking   Also: Low-dose CT lung cancer screening is recommended for patients who are 34-59 years of age with a 30+ pack-year history of smoking, and who are currently smoking or quit <=15 years ago.   Next ov need to address Lung cancer screening if not addressed by pcp in meantime   >>> f/u in 6 m, no change rx in meantime    I had an extended discussion with the patient reviewing all relevant studies completed to date and  lasting 15 to 20 minutes of a 25 minute visit    Each maintenance medication was reviewed in detail including most importantly the difference between maintenance and prns and under what circumstances the prns are to be triggered using an action plan format that is not reflected in the computer generated alphabetically organized AVS.     Please see AVS for specific instructions unique to this visit that I personally wrote and verbalized to the the pt in detail and then reviewed with pt  by my nurse highlighting any  changes in therapy recommended at today's visit to their plan of care.

## 2019-02-11 NOTE — Patient Instructions (Addendum)
No change medications  Good luck on the the smoking cessation    Please schedule a follow up visit in 6 months but call sooner if needed  Add:  Discuss LDSCT next ov if not addressed by PCP in meantime

## 2019-04-03 ENCOUNTER — Other Ambulatory Visit: Payer: Self-pay | Admitting: Internal Medicine

## 2019-08-14 ENCOUNTER — Encounter: Payer: Self-pay | Admitting: *Deleted

## 2019-08-15 ENCOUNTER — Ambulatory Visit: Payer: Managed Care, Other (non HMO) | Admitting: Internal Medicine

## 2019-08-30 ENCOUNTER — Ambulatory Visit (INDEPENDENT_AMBULATORY_CARE_PROVIDER_SITE_OTHER): Payer: 59

## 2019-08-30 ENCOUNTER — Ambulatory Visit (INDEPENDENT_AMBULATORY_CARE_PROVIDER_SITE_OTHER): Payer: 59 | Admitting: Internal Medicine

## 2019-08-30 ENCOUNTER — Other Ambulatory Visit: Payer: Self-pay

## 2019-08-30 ENCOUNTER — Encounter: Payer: Self-pay | Admitting: Internal Medicine

## 2019-08-30 DIAGNOSIS — J449 Chronic obstructive pulmonary disease, unspecified: Secondary | ICD-10-CM

## 2019-08-30 MED ORDER — BREZTRI AEROSPHERE 160-9-4.8 MCG/ACT IN AERO: 2.0000 | INHALATION_SPRAY | Freq: Two times a day (BID) | RESPIRATORY_TRACT | 0 refills | Status: DC

## 2019-08-30 MED ORDER — PREDNISONE 10 MG PO TABS: ORAL_TABLET | ORAL | 0 refills | Status: DC

## 2019-08-30 MED ORDER — BREZTRI AEROSPHERE 160-9-4.8 MCG/ACT IN AERO: 2.0000 | INHALATION_SPRAY | Freq: Two times a day (BID) | RESPIRATORY_TRACT | 11 refills | Status: DC

## 2019-08-30 MED ORDER — FAMOTIDINE 20 MG PO TABS: ORAL_TABLET | ORAL | 11 refills | Status: AC

## 2019-08-30 MED ORDER — PANTOPRAZOLE SODIUM 40 MG PO TBEC: 40.0000 mg | DELAYED_RELEASE_TABLET | Freq: Every day | ORAL | 2 refills | Status: DC

## 2019-08-30 NOTE — Progress Notes (Signed)
Subjective:    Patient ID: Ann Richardson, female   DOB: 15-Sep-1960   MRN: [id number]    Brief patient profile:  59  yowf  Bostonian   MM/ quit smoking 03/2019   never intense aerobic but able to do sports/ soft ball and did fine until around 2010 with doe with yardwork with assoc cough in addition has in frequency frequency episodes of bronchitis rx abx/ prednisone better then tried advair/ singulair no better so referred pulmonary clinic 11/26/2014 by  Cooper Render NP with dx GOLD II copd/ MM phenotype  01/08/15  With GOLD III criteria 10/30/2017 assoc with ongoing smoking   History of Present Illness  11/26/2014 1st [facility] office visit/ [name]   Chief Complaint  Patient presents with  . Pulmonary Consult    Referred by Cooper Render, NP. Pt states she has been dxed with Bronchitis x 3 so far in 2016. She c/o cough and SOB. She gets SOB with walking up stairs. Her cough is non prod at this time. She is using albuterol inhaler 2-3 x per wk on average.   no real change symptoms on advair but hfa poor  Cough quite a bit worse in am but not productive p last round of prednisone and her goal is to prevent future flares  rec Stop advair and singulair  Plan A = automatic = symbicort 80 Take 2 puffs first thing in am and then another 2 puffs about 12 hours later.  Plan B = backup - Only use your albuterol as a rescue medication   01/08/2015 f/u ov/[name] re: copd  GOLD II/ confused with meds/ names maint vs prns  - thinks she's still on advair  Chief Complaint  Patient presents with  . office visit    pft done, inhalers are helping, breathing not bad asit was going up stairs & 1st thing in the AM   poor mdi but doing better / cough some better but still some  Min white mucus production  rec You have GOLD II severity-  moderate and will continue to progress as long as you smoke - the key is to stop smoking now  Continue symbicort  160  Take 2 puffs first thing in am and then another 2  puffs about 12 hours later.  Work on inhaler technique:       10/30/2017  f/u ov/[name] re:  GOLD III copd now on symb 160 2bid Chief Complaint  Patient presents with  . Follow-up    Feeling better ,PFT today.  Dyspnea:  Limited by L leg in splint Cough: some first thing in am > mucoid Sleep: 1 pillows  SABA use: none since started on acid suppression    rec Pantoprazole (protonix) 40 mg   Take  30-60 min before first meal of the day and Pepcid (famotidine)  20 mg one @  bedtime until return to office - this is the best way to tell whether stomach acid is contributing to your problem.   Change symbicort to  Bevespi Take 2 puffs first thing in am and then another 2 puffs about 12 hours later.  Work on inhaler technique:  The key is to stop smoking completely before smoking completely stops you!     03/29/2018  f/u ov/[name] re: copd GOLD II/ still smoking  Chief Complaint  Patient presents with  . Follow-up    went to UC after last visit and was dxed with bronchitis. She states still having SOB and prod cough with  clear sputum.  She is using her ventolin 2 x daily on average.   75% better while on aug/pred then gradually worse  > eval by NP  02/27/18  rec Continue Bevespi- 2 puffs twice a day Add inhaled corticosteroid - Qvar 1 puff twice a day  Back up- Albuterol rescue inhaler  Start zyrtec daily as needed  Sending in RX dymista nasal spray   Some better while on dymista  and substituted flonase/atelin and some better then a lot worse 03/10/18 and seen in UC 03/17/18 with zpak / pred > seemed some better then worse off it  rec GERDdiet Stop bevespi and qvar  Plan A = Automatic = symbicort 160 Take 2 puffs first thing in am and then another 2 puffs about 12 hours later and spiriva 2 pffs each am  Plan B = Backup Only use your albuterol as a rescue medication  Plan C = Crisis - only use your albuterol nebulizer if you first try Plan B and it fails to help  For cough/ congestion >  mucinex dm  1200 mg every 12 hours and use the flutter valve as much as you can  Prednisone 10 mg Take 4 for three days 3 for three days 2 for three days 1 for three days and stop  chedule sinus CT > min edema L max Please schedule a follow up office visit in 6 weeks, call sooner if needed with all medications /inhalers/ solutions in hand    11/14/2 Cough: worse in am x1st 15 min => produces  one tbsp, slt discolored/ thick not using mucinex dm or flutter as rec  Sleeping: flat/ three pillow  SABA use: rarely using  rec         02/11/2019  f/u ov/[name] re:  GOLD II maint symb/ spriva/ still smoking but down to 4-5 per day on chantix  Chief Complaint  Patient presents with  . Follow-up    Patient reports that her breathing is doing well. She reports using her rescue inhaler with extertion. She also reports a cough when she gets too hot.  Dyspnea:  MMRC1 = can walk nl pace, flat grade, can't hurry or go uphills or steps s sob / worse with mask Works from home behind computer  Cough: some first thing in am x 15 min and productive white min vol Sleeping: no resp symptoms / flat bed / 2 pilows  SABA use: very rarely x with mask in grocery store seems to helps  02: none    08/30/2019  f/u ov/[name] re: GOLD II / no longer smoking / maint on symb/spiriva Chief Complaint  Patient presents with  . Follow-up    COPD GOLD II / Quit smoking April 15, 2019  Dyspnea:  Was doing walk around complex x 20 min consistently and then gradually lost ground since quit smoking, esp in last month - has been gaining wt and now doesn't leave house = MMRC4  = sob if tries to leave home or while getting dressed   Cough: less since quit with urge to clear throat but no mucus  Sleeping: able to lie flat / 2-3 pillows  SABA use: never prechallenges and never helps recover quicker  02: none    No obvious day to day or daytime variability or assoc excess/ purulent sputum or mucus plugs or hemoptysis or cp or  chest tightness, subjective wheeze or overt sinus or hb symptoms.   sleeping without nocturnal  or early am exacerbation  of respiratory  c/o's or need for noct saba. Also denies any obvious fluctuation of symptoms with weather or environmental changes or other aggravating or alleviating factors except as outlined above   No unusual exposure hx or h/o childhood pna/ asthma or knowledge of premature birth.  Current Allergies, Complete Past Medical History, Past Surgical History, Family History, and Social History were reviewed in [employer] record.  ROS  The following are not active complaints unless bolded Hoarseness, sore throat, dysphagia, dental problems, itching, sneezing,  nasal congestion or discharge of excess mucus or purulent secretions, ear ache,   fever, chills, sweats, unintended wt loss or wt gain, classically pleuritic or exertional cp,  orthopnea pnd or arm/hand swelling  or leg swelling, presyncope, palpitations, abdominal pain, anorexia, nausea, vomiting, diarrhea  or change in bowel habits or change in bladder habits, change in stools or change in urine, dysuria, hematuria,  rash, arthralgias, visual complaints, headache, numbness, weakness or ataxia or problems with walking or coordination,  change in mood or  memory.        Current Meds  Medication Sig  . ALPRAZolam (XANAX) 1 MG tablet Take 1 mg by mouth at bedtime as needed.   Marland Kitchen ascorbic acid (VITAMIN C) 1000 MG tablet Vitamin C 1,000 mg tablet  Take 3 tablets every day by oral route.  Marland Kitchen atorvastatin (LIPITOR) 10 MG tablet Take 5 mg by mouth at bedtime.   . Calcium Carbonate-Vit D-Min (CALCIUM 1200 PO) calcium  1200 mg  . Cholecalciferol (VITAMIN D3) 50 MCG (2000 UT) TABS   . Respiratory Therapy Supplies (FLUTTER) DEVI Use as directed  . SPIRIVA RESPIMAT 2.5 MCG/ACT AERS INHALE 2 PUFFS INTO THE LUNGS DAILY  . SYMBICORT 160-4.5 MCG/ACT inhaler INHALE 2 PUFFS INTO THE LUNGS 2 TIMES DAILY  . Vitamin  D, Ergocalciferol, (DRISDOL) 1.25 MG (50000 UNIT) CAPS capsule Take 50,000 Units by mouth once a week.                          Objective:   Physical Exam    08/30/2019         151  02/11/2019        151  08/10/2018        131  05/10/2018      125 03/29/2018        122  02/06/2018       123 10/30/2017         127  09/22/2017       127   06/06/2016    130  04/10/15 141 lb 6.4 oz (64.139 kg)  01/08/15 140 lb (63.504 kg)  11/26/14 137 lb (62.143 kg)       HEENT : pt wearing mask not removed for exam due to covid - 19 concerns.    NECK :  without JVD/Nodes/TM/ nl carotid upstrokes bilaterally   LUNGS: no acc muscle use,  Mild barrel  contour chest wall with bilateral  Distant bs s audible wheeze and  without cough on insp or exp maneuvers  and mild  Hyperresonant  to  percussion bilaterally     CV:  RRR  no s3 or murmur or increase in P2, and no edema   ABD:  soft and nontender with pos end  insp Hoover's  in the supine position. No bruits or organomegaly appreciated, bowel sounds nl  MS:   Nl gait/  ext warm without deformities, calf tenderness, cyanosis or clubbing No  obvious joint restrictions   SKIN: warm and dry without lesions    NEURO:  alert, approp, nl sensorium with  no motor or cerebellar deficits apparent.          CXR PA and Lateral:   08/30/2019 :    I personally reviewed images and agree with radiology impression as follows:    COPD.  No active cardiopulmonary disease.   Labs 08/22/19  HC02 = 31 (no change)/ TSH nl / cbc nl with Eos 0.4        Assessment:

## 2019-08-30 NOTE — Patient Instructions (Addendum)
Plan A = Automatic = Always=    Breztri  Take 2 puffs first thing in am and then another 2 puffs about 12 hours later.    Plan B = Backup (to supplement plan A, not to replace it) Only use your albuterol inhaler as a rescue medication to be used if you can't catch your breath by resting or doing a relaxed purse lip breathing pattern.  - The less you use it, the better it will work when you need it. - Ok to use the inhaler up to 2 puffs  every 4 hours if you must but call for appointment if use goes up over your usual need - Don't leave home without it !!  (think of it like the spare tire for your car)    Prednisone 10 mg take  4 each am x 2 days,   2 each am x 2 days,  1 each am x 2 days and stop   Pantoprazole (protonix) 40 mg   Take  30-60 min before first meal of the day and Pepcid (famotidine)  20 mg one @  bedtime until return to office - this is the best way to tell whether stomach acid is contributing to your problem.    Try albuterol 15 min before an activity that you know would make you short of breath and see if it makes any difference and if makes none then don't take it after activity unless you can't catch your breath.  GERD (REFLUX)  is an extremely common cause of respiratory symptoms just like yours , many times with no obvious heartburn at all.    It can be treated with medication, but also with lifestyle changes including elevation of the head of your bed (ideally with 6 -8inch blocks under the headboard of your bed),  Smoking cessation, avoidance of late meals, excessive alcohol, and avoid fatty foods, chocolate, peppermint, colas, red wine, and acidic juices such as orange juice.  NO MINT OR MENTHOL PRODUCTS SO NO COUGH DROPS  USE SUGARLESS CANDY INSTEAD (Jolley ranchers or Stover's or Life Savers) or even ice chips will also do - the key is to swallow to prevent all throat clearing. NO OIL BASED VITAMINS - use powdered substitutes.  Avoid fish oil when coughing.        Please remember to go to the  x-ray department  for your tests - we will call you with the results when they are available    Please schedule a follow up office visit in 2 weeks, sooner if needed

## 2019-09-01 ENCOUNTER — Encounter: Payer: Self-pay | Admitting: Internal Medicine

## 2019-09-01 NOTE — Assessment & Plan Note (Addendum)
Quit smoking 03/2019    - 01/08/2015 pfts  FEV1  1.31 (54%) s am meds and ratio 56% p 9% improvement form saba with dlco 57 to 73% p correct for alv vol > rec symb 160 2bid  - alpha one  01/08/2015 > MM - PFT's  10/30/2017  FEV1 1.00 (43 % ) ratio 51  p 0 % improvement from saba p symbicort 160 x 2  prior to study with DLCO  39 % corrects to 52 % for alv volume  - 10/30/2017  After extensive coaching inhaler device  effectiveness =    75% > try bevespi 2bid   02/27/2018- Added Qvar 40 03/29/18  changed to symb 160/spiriva  And added flutter valve 08/30/2019  After extensive coaching inhaler device,  effectiveness =    75% try breztri 2 bid  DDX of  difficult airways management almost all start with A and  include Adherence, Ace Inhibitors, Acid Reflux, Active Sinus Disease, Alpha 1 Antitripsin deficiency, Anxiety masquerading as Airways dz,  ABPA,  Allergy(esp in young), Aspiration (esp in elderly), Adverse effects of meds,  Active smoking or vaping, A bunch of PE's (a small clot burden can't cause this syndrome unless there is already severe underlying pulm or vascular dz with poor reserve) plus two Bs  = Bronchiectasis and Beta blocker use..and one C= CHF  Adherence is always the initial "prime suspect" and is a multilayered concern that requires a "trust but verify" approach in every patient - starting with knowing how to use medications, especially inhalers, correctly, keeping up with refills and understanding the fundamental difference between maintenance and prns vs those medications only taken for a very short course and then stopped and not refilled.  - see hfa teaching - I spent extra time with pt today reviewing appropriate use of albuterol for prn use on exertion with the following points: 1) saba is for relief of sob that does not improve by walking a slower pace or resting but rather if the pt does not improve after trying this first. 2) If the pt is convinced, as many are, that saba helps recover  from activity faster then it's easy to tell if this is the case by re-challenging : ie stop, take the inhaler, then p 5 minutes try the exact same activity (intensity of workload) that just caused the symptoms and see if they are substantially diminished or not after saba 3) if there is an activity that reproducibly causes the symptoms, try the saba 15 min before the activity on alternate days   If in fact the saba really does help, then fine to continue to use it prn but advised may need to look closer at the maintenance regimen being used to achieve better control of airways disease with exertion.    ? Acid (or non-acid) GERD > always difficult to exclude as up to 75% of pts in some series report no assoc GI/ Heartburn symptoms> rec max (24h)  acid suppression and diet restrictions/ reviewed and instructions given in writing.   ? Allergy/ asthma > Prednisone 10 mg take  4 each am x 2 days,   2 each am x 2 days,  1 each am x 2 days and stop   ? Anxiety/depression/ decondtioning (note she complains of wt gain but actually not the case) related to stop smoking and covid restrictions > usually at the bottom of this list of usual suspects   and may interfere with adherence and also interpretation of response  or lack thereof to symptom management which can be quite subjective.   ? Adverse drug effects > none of the usual suspects listed   ? Active smoking > denies, reinforced importance  of maint off     Group D in terms of symptom/risk and laba/lama/ICS  therefore appropriate rx at this point >>>  Try breztri x 2 weeks then regroup in office.          Each maintenance medication was reviewed in detail including emphasizing most importantly the difference between maintenance and prns and under what circumstances the prns are to be triggered using an action plan format where appropriate.  Total time for H and P, chart review, counseling, teaching device and generating customized AVS unique to  this office visit / charting = 30 min

## 2019-09-02 NOTE — Progress Notes (Signed)
Spoke with pt and notified of results per Dr. Wert. Pt verbalized understanding and denied any questions. 

## 2019-09-16 NOTE — Progress Notes (Signed)
@Patient  ID: Ann Richardson, female    DOB: 04-Aug-1960, 59 y.o.   MRN: [id number]  Chief Complaint  Patient presents with  . Follow-up    2 wk f/u for COPD. States she is still having issues with SOB, especially when exercising and talking. Still has a slight cough.     Referring provider: Heywood Bene, *  HPI:  59 year old female current everyday smoker followed in our office for COPD  PMH: Spinal stenosis Smoker/ Smoking History: Former Smoker  Maintenance:  Breztri  Pt of: Dr. Melvyn Novas  09/17/2019  - Visit   59 year old female former smoker presenting to office today as a follow-up visit for COPD.  This is a 2-week follow-up where patient was treated with a course of prednisone as well as started on a new inhaler Breztri.  Patient did not feel that the prednisone helped her much.  She has been maintained on the Ryegate.  She feels that she coughs more since being transitioned to this inhaler.  She was previously managed on Symbicort 160 and Spiriva Respimat 2.5.  She quit smoking in October/2020.  She feels that her shortness of breath and dyspnea on exertion had worsened in January/2021.  Of note patient has also had a weight gain of about 20 pounds over the last 3 months.  She is not on any sort of fluid pills.  Chest x-ray from 2 weeks ago was clear with no acute abnormalities but may be indicative of pulmonary arterial hypertension based off the imaging findings.  Patient does not have an echocardiogram on file.  Patient continues to have throat clearing.  She remains adherent to her PPI as well as her H2 blocker.  She does not feel she is having reflux symptoms next  Patient's primary care has been managing her sleep troubles.  Patient reports that this has been an ongoing issue.  Primary care is been managing this with Xanax.  This was recently increased due to the patient having an increased tolerance.  She has previously been tried on Ambien, trazodone and melatonin.   She reports that none of these worked for her.  Patient reporting difficulty initiating sleep as well as maintaining sleep.  She does watch TV sometimes before going to bed.  She reports that when she did not take the Xanax this weekend it took around 4 hours to be able to go to sleep.   STOP BANG questionnaire  Snoring? Yes  Tiredness? Yes  Observed apneas? No  Elevated blood pressure? No   BMI greater than 35? No  Age greater than 52? Yes  Neck circumference greater than 40 cm? Female gender? No   Scoring:  One-point is signed for each.   0-2 equals low risk, 3-4 equals intermediate risk, those with 5 or greater high risk for having obstructive sleep apnea  Score: 3   Questionaires / Pulmonary Flowsheets:   MMRC: mMRC Dyspnea Scale mMRC Score  09/17/2019 3  09/17/2019 0    Tests:   02/27/2018-CBC with differential eosinophils relative 1.2, eosinophils absolute 0.1  08/31/2019-chest x-ray-COPD, no active cardiopulmonary disease  04/02/2018-CT maxillofacial-mild mucosal edema base of left maxillary sinus, otherwise sinuses are clear  10/30/2017-pulmonary function test-FVC 1.94 (64% predicted), postbronchodilator ratio 51, postbronchodilator FEV1 1.00 (43% predicted), no bronchodilator response, DLCO 8.07 (39% predicted) >>> Patient took Symbicort 160 before this  FENO:  No results found for: NITRICOXIDE  PFT: PFT Results Latest Ref Rng & Units 10/30/2017 01/08/2015  FVC-Pre L 1.94 2.06  FVC-Predicted  Pre % 64 67  FVC-Post L 1.98 2.33  FVC-Predicted Post % 66 75  Pre FEV1/FVC % % 53 58  Post FEV1/FCV % % 51 56  FEV1-Pre L 1.03 1.20  FEV1-Predicted Pre % 44 50  FEV1-Post L 1.00 1.31  DLCO UNC% % 39 57  DLCO COR %Predicted % 52 73  TLC L - 4.69  TLC % Predicted % - 101  RV % Predicted % - 132    WALK:  No flowsheet data found.  Imaging: DG Chest 2 View  Result Date: 08/31/2019 CLINICAL DATA:  Shortness of breath.  COPD. EXAM: CHEST - 2 VIEW COMPARISON:  03/29/2018  FINDINGS: The heart size and mediastinal contours are within normal limits. Stable mild pulmonary hyperinflation. Tiny calcified granulomata are again seen in both lung bases. Both lungs are otherwise clear. The visualized skeletal structures are unremarkable. IMPRESSION: COPD.  No active cardiopulmonary disease. Electronically Signed   By: Danae Orleans M.D.   On: 08/31/2019 18:37    Lab Results:  CBC    Component Value Date/Time   WBC 11.9 (H) 02/27/2018 0952   RBC 4.41 02/27/2018 0952   HGB 14.1 02/27/2018 0952   HCT 40.6 02/27/2018 0952   PLT 270.0 02/27/2018 0952   MCV 92.2 02/27/2018 0952   MCH 31.6 06/23/2016 0800   MCHC 34.7 02/27/2018 0952   RDW 12.7 02/27/2018 0952   LYMPHSABS 2.5 02/27/2018 0952   MONOABS 0.8 02/27/2018 0952   EOSABS 0.1 02/27/2018 0952   BASOSABS 0.1 02/27/2018 0952    BMET    Component Value Date/Time   NA 138 06/23/2016 0800   K 3.7 06/23/2016 0800   CL 101 06/23/2016 0800   CO2 26 06/23/2016 0800   GLUCOSE 98 06/23/2016 0800   BUN 8 06/23/2016 0800   CREATININE 0.70 06/23/2016 0800   CALCIUM 9.4 06/23/2016 0800   GFRNONAA >60 06/23/2016 0800   GFRAA >60 06/23/2016 0800    BNP No results found for: BNP  ProBNP No results found for: PROBNP  Specialty Problems      Pulmonary Problems   COPD GOLD III    Followed in Pulmonary clinic/ [facility]/ [facility] Quit smoking 03/2019    - 01/08/2015 pfts  FEV1  1.31 (54%) s am meds and ratio 56% p 9% improvement form saba with dlco 57 to 73% p correct for alv vol > rec symb 160 2bid  - alpha one  01/08/2015 > MM - PFT's  10/30/2017  FEV1 1.00 (43 % ) ratio 51  p 0 % improvement from saba p symbicort 160 x 2  prior to study with DLCO  39 % corrects to 52 % for alv volume  - 10/30/2017  After extensive coaching inhaler device  effectiveness =    75% > try bevespi 2bid   02/27/2018- Added Qvar 40 03/29/18  changed to symb 160/spiriva  And added flutter valve 03/2019 - quit smoking  08/30/2019  After  extensive coaching inhaler device,  effectiveness =    75% try breztri 2 bid 09/17/2019 - referred to pulmonary rehab         Upper airway cough syndrome    - sinus CT  04/02/2018 Mild mucosal edema base of left maxillary sinus. Otherwise sinuses are clear         Allergies  Allergen Reactions  . Other Nausea And Vomiting    Projectile vomiting; oxycodone     Immunization History  Administered Date(s) Administered  . Influenza,inj,Quad PF,6+ Mos 02/20/2019  .  Pneumococcal Polysaccharide-23 08/22/2018  . Tdap 08/22/2018  . Zoster Recombinat (Shingrix) 06/23/2019, 08/22/2019    Past Medical History:  Diagnosis Date  . Arthritis    spine- degenerative   . COPD (chronic obstructive pulmonary disease) (HCC)    CXR- 05/2016  . Hyperlipidemia   . 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 Pre-diabetes    pt. reports last A1C - 5/5, had been higher at one time     Tobacco History: Social History   Tobacco Use  Smoking Status Former Smoker  . Packs/day: 1.00  . Years: 35.00  . Pack years: 35.00  . Quit date: 04/13/2019  . Years since quitting: 0.4  Smokeless Tobacco Never Used   Counseling given: Yes   Continue to not smoke  Outpatient Encounter Medications as of 09/17/2019  Medication Sig  . albuterol (PROVENTIL HFA;VENTOLIN HFA) 108 (90 BASE) MCG/ACT inhaler Inhale 2 puffs into the lungs every 6 (six) hours as needed for wheezing or shortness of breath.  . ALPRAZolam (XANAX) 1 MG tablet Take 1 mg by mouth at bedtime as needed.   Marland Kitchen ascorbic acid (VITAMIN C) 1000 MG tablet Vitamin C 1,000 mg tablet  Take 3 tablets every day by oral route.  Marland Kitchen atorvastatin (LIPITOR) 10 MG tablet Take 5 mg by mouth at bedtime.   . Budeson-Glycopyrrol-Formoterol (BREZTRI AEROSPHERE) 160-9-4.8 MCG/ACT AERO Inhale 2 puffs into the lungs 2 (two) times daily.  . Calcium Carbonate-Vit D-Min (CALCIUM 1200 PO) calcium  1200 mg  . famotidine (PEPCID) 20 MG tablet One  after supper  . pantoprazole (PROTONIX) 40 MG tablet Take 1 tablet (40 mg total) by mouth daily. Take 30-60 min before first meal of the day  . Vitamin D, Ergocalciferol, (DRISDOL) 1.25 MG (50000 UNIT) CAPS capsule Take 50,000 Units by mouth once a week.  . [DISCONTINUED] Cholecalciferol (VITAMIN D3) 50 MCG (2000 UT) TABS   . [DISCONTINUED] predniSONE (DELTASONE) 10 MG tablet Take  4 each am x 2 days,   2 each am x 2 days,  1 each am x 2 days and stop  . [DISCONTINUED] Respiratory Therapy Supplies (FLUTTER) DEVI Use as directed   No facility-administered encounter medications on file as of 09/17/2019.     Review of Systems  Review of Systems  Constitutional: Positive for activity change. Negative for chills, fatigue and fever.  HENT: Negative for congestion, postnasal drip and rhinorrhea.        Throat clearing   Respiratory: Positive for shortness of breath. Negative for cough and wheezing.   Cardiovascular: Negative for chest pain and palpitations.  Gastrointestinal:       Denies gerd symptoms      Physical Exam  BP 120/70 (BP Location: Left Arm, Patient Position: Sitting, Cuff Size: Normal)   Pulse 92   Temp (!) 97.4 F (36.3 C) (Temporal)   Ht 5' (1.524 m)   Wt 150 lb (68 kg)   SpO2 94% Comment: on RA  BMI 29.29 kg/m   Wt Readings from Last 5 Encounters:  09/17/19 150 lb (68 kg)  08/30/19 151 lb 3.2 oz (68.6 kg)  02/11/19 127 lb 3.2 oz (57.7 kg)  08/10/18 131 lb 6.4 oz (59.6 kg)  05/10/18 125 lb 9.6 oz (57 kg)    BMI Readings from Last 5 Encounters:  09/17/19 29.29 kg/m  08/30/19 29.53 kg/m  02/11/19 24.84 kg/m  08/10/18 24.83 kg/m  05/10/18 23.73 kg/m     Physical Exam Vitals and nursing note  reviewed.  Constitutional:      General: She is not in acute distress.    Appearance: Normal appearance. She is obese.  HENT:     Head: Normocephalic and atraumatic.     Right Ear: Tympanic membrane, ear canal and external ear normal. There is no impacted  cerumen.     Left Ear: Tympanic membrane, ear canal and external ear normal. There is no impacted cerumen.     Nose: Congestion present.     Mouth/Throat:     Mouth: Mucous membranes are moist.     Pharynx: Oropharynx is clear.  Eyes:     Pupils: Pupils are equal, round, and reactive to light.  Cardiovascular:     Rate and Rhythm: Normal rate and regular rhythm.     Pulses: Normal pulses.     Heart sounds: Normal heart sounds. No murmur.  Pulmonary:     Effort: Pulmonary effort is normal. No respiratory distress.     Breath sounds: No decreased air movement. No decreased breath sounds, wheezing or rales.     Comments: Diminished breath sounds that exam Musculoskeletal:     Cervical back: Normal range of motion.  Skin:    General: Skin is warm and dry.     Capillary Refill: Capillary refill takes less than 2 seconds.  Neurological:     General: No focal deficit present.     Mental Status: She is alert and oriented to person, place, and time. Mental status is at baseline.     Gait: Gait normal.  Psychiatric:        Mood and Affect: Mood normal.        Behavior: Behavior normal.        Thought Content: Thought content normal.        Judgment: Judgment normal.       Assessment & Plan:   COPD GOLD III Reviewed 2019 pulmonary function test today with patient Patient feels her breathing is a little bit better on Breztri inhaler Potential for having pulmonary hypertension based off of last chest x-ray which was clear No echocardiogram on file COPD stage III based off of May/2019 pulmonary function test Quit smoking in October/2020 Continues to have ongoing dyspnea, recent weight increase over the last 2 to 3 months  Plan: Walk today in office Lab work today in office Referral to pulmonary rehab Continue Breztri  If shortness of breath persists may need to consider echocardiogram or repeat pulmonary function testing 4-week follow-up in our office with Dr. Sherene Sires  Former  smoker Quit smoking October/2020  Plan: Continue to not smoke  Upper airway cough syndrome Discussed GERD diet as well as lifestyle changes today with patient Provided additional information on AVS Nasal congestion on physical exam   Plan: Continue Pepcid Continue Protonix Could consider trial of antihistamine   Trouble in sleeping Plan: We will refer to sleep MD in our office for further evaluation    Return in about 4 weeks (around 10/15/2019), or if symptoms worsen or fail to improve, for Follow up with Dr. Sherene Sires.   Coral Ceo, NP 09/17/2019   This appointment required 42 minutes of patient care (this includes precharting, chart review, review of results, face-to-face care, etc.).

## 2019-09-17 ENCOUNTER — Ambulatory Visit (INDEPENDENT_AMBULATORY_CARE_PROVIDER_SITE_OTHER): Payer: 59 | Admitting: Pulmonary Disease

## 2019-09-17 ENCOUNTER — Encounter: Payer: Self-pay | Admitting: Pulmonary Disease

## 2019-09-17 ENCOUNTER — Other Ambulatory Visit: Payer: Self-pay

## 2019-09-17 ENCOUNTER — Encounter (HOSPITAL_COMMUNITY): Payer: Self-pay | Admitting: *Deleted

## 2019-09-17 ENCOUNTER — Telehealth (HOSPITAL_COMMUNITY): Payer: Self-pay

## 2019-09-17 VITALS — BP 120/70 | HR 92 | Temp 97.4°F | Ht 60.0 in | Wt 150.0 lb

## 2019-09-17 DIAGNOSIS — Z87891 Personal history of nicotine dependence: Secondary | ICD-10-CM | POA: Diagnosis not present

## 2019-09-17 DIAGNOSIS — R0602 Shortness of breath: Secondary | ICD-10-CM

## 2019-09-17 DIAGNOSIS — R05 Cough: Secondary | ICD-10-CM

## 2019-09-17 DIAGNOSIS — J449 Chronic obstructive pulmonary disease, unspecified: Secondary | ICD-10-CM | POA: Diagnosis not present

## 2019-09-17 DIAGNOSIS — G479 Sleep disorder, unspecified: Secondary | ICD-10-CM | POA: Insufficient documentation

## 2019-09-17 DIAGNOSIS — R058 Other specified cough: Secondary | ICD-10-CM

## 2019-09-17 LAB — CBC WITH DIFFERENTIAL/PLATELET
Basophils Absolute: 0.1 10*3/uL (ref 0.0–0.1)
Basophils Relative: 0.5 % (ref 0.0–3.0)
Eosinophils Absolute: 0.3 10*3/uL (ref 0.0–0.7)
Eosinophils Relative: 2.4 % (ref 0.0–5.0)
HCT: 39.4 % (ref 36.0–46.0)
Hemoglobin: 13.3 g/dL (ref 12.0–15.0)
Lymphocytes Relative: 21 % (ref 12.0–46.0)
Lymphs Abs: 2.6 10*3/uL (ref 0.7–4.0)
MCHC: 33.8 g/dL (ref 30.0–36.0)
MCV: 93.4 fl (ref 78.0–100.0)
Monocytes Absolute: 1 10*3/uL (ref 0.1–1.0)
Monocytes Relative: 7.7 % (ref 3.0–12.0)
Neutro Abs: 8.6 10*3/uL — ABNORMAL HIGH (ref 1.4–7.7)
Neutrophils Relative %: 68.4 % (ref 43.0–77.0)
Platelets: 260 10*3/uL (ref 150.0–400.0)
RBC: 4.21 Mil/uL (ref 3.87–5.11)
RDW: 12.6 % (ref 11.5–15.5)
WBC: 12.6 10*3/uL — ABNORMAL HIGH (ref 4.0–10.5)

## 2019-09-17 LAB — COMPREHENSIVE METABOLIC PANEL
ALT: 20 U/L (ref 0–35)
AST: 19 U/L (ref 0–37)
Albumin: 4.3 g/dL (ref 3.5–5.2)
Alkaline Phosphatase: 91 U/L (ref 39–117)
BUN: 19 mg/dL (ref 6–23)
CO2: 30 mEq/L (ref 19–32)
Calcium: 9.8 mg/dL (ref 8.4–10.5)
Chloride: 102 mEq/L (ref 96–112)
Creatinine, Ser: 0.72 mg/dL (ref 0.40–1.20)
GFR: 82.58 mL/min (ref >60.00)
Glucose, Bld: 158 mg/dL — ABNORMAL HIGH (ref 70–99)
Potassium: 4 mEq/L (ref 3.5–5.1)
Sodium: 138 mEq/L (ref 135–145)
Total Bilirubin: 0.4 mg/dL (ref 0.2–1.2)
Total Protein: 7.6 g/dL (ref 6.0–8.3)

## 2019-09-17 LAB — BRAIN NATRIURETIC PEPTIDE: Pro B Natriuretic peptide (BNP): 16 pg/mL (ref 0.0–100.0)

## 2019-09-17 NOTE — Assessment & Plan Note (Signed)
Discussed GERD diet as well as lifestyle changes today with patient Provided additional information on AVS Nasal congestion on physical exam   Plan: Continue Pepcid Continue Protonix Could consider trial of antihistamine

## 2019-09-17 NOTE — Progress Notes (Signed)
Received referral from Dr. Sherene Sires for this pt to participate in pulmonary rehab with the the diagnosis of COPD Mixed Type. Pt most recent PFT 10/2017 showed FEV1 43.  Pt stopped smoking 03/2019.  Clinical review of pt follow up appt on 09/17/19 with Elisha Headland NP with Dr Sherene Sires  Pulmonary office note.  Pt with Covid Risk Score - 2. Pt appropriate for scheduling for Pulmonary rehab.  Will forward to support staff for verification of insurance eligibility/benefits and to pulmonary rehab staff for scheduling with pt consent. Alanson Aly, BSN Cardiac and Emergency planning/management officer

## 2019-09-17 NOTE — Patient Instructions (Addendum)
You were seen today by Lauraine Rinne, NP  for:   [facility] meeting you in office today.  We will keep you on the same inhalers at this time.  We will do lab work today.  We will have you schedule an appointment for follow-up in 4 weeks with Dr. Melvyn Novas as well as a sleep MD in 8 weeks as a consult.  We will refer you to pulmonary rehab.  When we have your lab results we will follow-up with you.  Take care and stay safe,  Jeylin Woodmansee  1. COPD GOLD II/ still smoking   Breztri >>> 2 puffs in the morning right when you wake up, rinse out your mouth after use, 12 hours later 2 puffs, rinse after use >>> Take this daily, no matter what >>> This is not a rescue inhaler   Only use your albuterol as a rescue medication to be used if you can't catch your breath by resting or doing a relaxed purse lip breathing pattern.  - The less you use it, the better it will work when you need it. - Ok to use up to 2 puffs  every 4 hours if you must but call for immediate appointment if use goes up over your usual need - Don't leave home without it !!  (think of it like the spare tire for your car)   Note your daily symptoms > remember "red flags" for COPD:   >>>Increase in cough >>>increase in sputum production >>>increase in shortness of breath or activity  intolerance.   If you notice these symptoms, please call the office to be seen.    2. Former smoker  Continue to not smoke  3. Upper airway cough syndrome  Continue Pepcid  Protonix  40 mg tablet  >>>Please take 1 tablet daily 15 minutes to 30 minutes before your first meal of the day as well as before your other medications >>>Try to take at the same time each day >>>take this medication daily  GERD management: >>>Avoid laying flat until 2 hours after meals >>>Elevate head of the bed including entire chest >>>Reduce size of meals and amount of fat, acid, spices, caffeine and sweets >>>If you are smoking, Please stop! >>>Decrease alcohol  consumption >>>Work on maintaining a healthy weight with normal BMI   You can consider taking a daily antihistamine:  >>>choose one of: zyrtec, claritin, allegra, or xyzal  >>>these are over the counter medications  >>>can choose generic option  >>>take daily  >>>this medication helps with allergies, post nasal drip, and cough    4. Trouble in sleeping  We will get you set up for a consult with one of our sleep MDs in office  5. Shortness of breath  - B Nat Peptide; Future - Comp Met (CMET); Future - CBC with Differential/Platelet; Future  Based off your lab work we may need to consider a trial of a fluid pill or consider getting an echocardiogram to evaluate your heart.  Your walk today in office was stable without any oxygen desaturations this is good news  We recommend today:  Orders Placed This Encounter  Procedures  . B Nat Peptide    Standing Status:   Future    Standing Expiration Date:   09/16/2020  . Comp Met (CMET)    Standing Status:   Future    Standing Expiration Date:   09/16/2020  . CBC with Differential/Platelet    Standing Status:   Future    Standing Expiration Date:  09/16/2020   Orders Placed This Encounter  Procedures  . B Nat Peptide  . Comp Met (CMET)  . CBC with Differential/Platelet   No orders of the defined types were placed in this encounter.   Follow Up:    Return in about 4 weeks (around 10/15/2019), or if symptoms worsen or fail to improve, for Follow up with Dr. Melvyn Novas.  6-8 week consult with SLEEP MD in [place] office.    Please do your part to reduce the spread of COVID-19:      Reduce your risk of any infection  and COVID19 by using the similar precautions used for avoiding the common cold or flu:  Marland Kitchen Wash your hands often with soap and warm water for at least 20 seconds.  If soap and water are not readily available, use an alcohol-based hand sanitizer with at least 60% alcohol.  . If coughing or sneezing, cover your mouth  and nose by coughing or sneezing into the elbow areas of your shirt or coat, into a tissue or into your sleeve (not your hands). Langley Gauss A MASK when in public  . Avoid shaking hands with others and consider head nods or verbal greetings only. . Avoid touching your eyes, nose, or mouth with unwashed hands.  . Avoid close contact with people who are sick. . Avoid places or events with large numbers of people in one location, like concerts or sporting events. . If you have some symptoms but not all symptoms, continue to monitor at home and seek medical attention if your symptoms worsen. . If you are having a medical emergency, call 911.   [place] / e-Visit: [email or web address]         [facility]: [facility]: [phone]                   [facility]: [phone]     It is flu season:   >>> Best ways to protect herself from the flu: Receive the yearly flu vaccine, practice good hand hygiene washing with soap and also using hand sanitizer when available, eat a nutritious meals, get adequate rest, hydrate appropriately   Please contact the office if your symptoms worsen or you have concerns that you are not improving.   Thank you for choosing  [facility] for your healthcare, and for allowing [place] to partner with you on your healthcare journey. I am thankful to be able to provide care to you today.   Wyn Quaker FNP-C

## 2019-09-17 NOTE — Telephone Encounter (Signed)
Pt insurance is active and benefits verified through [employer] Co-pay 0, DED $3,000/$2,359.64 met, out of pocket $6,850/$2,365.78 met, co-insurance 20% after ded. no pre-authorization required, REF# [id number]

## 2019-09-17 NOTE — Assessment & Plan Note (Signed)
Plan: We will refer to sleep MD in our office for further evaluation

## 2019-09-17 NOTE — Assessment & Plan Note (Signed)
Quit smoking October/2020  Plan: Continue to not smoke

## 2019-09-17 NOTE — Assessment & Plan Note (Signed)
Reviewed 2019 pulmonary function test today with Ann Richardson Ann Richardson feels her breathing is a little bit better on Breztri inhaler Potential for having pulmonary hypertension based off of last chest x-ray which was clear No echocardiogram on file COPD stage III based off of May/2019 pulmonary function test Quit smoking in October/2020 Continues to have ongoing dyspnea, recent weight increase over the last 2 to 3 months  Plan: Walk today in office Lab work today in office Referral to pulmonary rehab Continue Breztri  If shortness of breath persists may need to consider echocardiogram or repeat pulmonary function testing 4-week follow-up in our office with Dr. Sherene Sires

## 2019-09-18 ENCOUNTER — Telehealth (HOSPITAL_COMMUNITY): Payer: Self-pay

## 2019-09-23 ENCOUNTER — Encounter: Payer: Self-pay | Admitting: Internal Medicine

## 2019-09-23 ENCOUNTER — Other Ambulatory Visit: Payer: Self-pay | Admitting: Physician Assistant

## 2019-09-23 ENCOUNTER — Other Ambulatory Visit: Payer: Self-pay

## 2019-09-23 ENCOUNTER — Ambulatory Visit (INDEPENDENT_AMBULATORY_CARE_PROVIDER_SITE_OTHER): Payer: 59 | Admitting: Internal Medicine

## 2019-09-23 VITALS — BP 112/68 | HR 98 | Temp 97.2°F | Ht 60.0 in | Wt 154.0 lb

## 2019-09-23 DIAGNOSIS — J449 Chronic obstructive pulmonary disease, unspecified: Secondary | ICD-10-CM

## 2019-09-23 DIAGNOSIS — G479 Sleep disorder, unspecified: Secondary | ICD-10-CM

## 2019-09-23 DIAGNOSIS — G4733 Obstructive sleep apnea (adult) (pediatric): Secondary | ICD-10-CM

## 2019-09-23 DIAGNOSIS — R252 Cramp and spasm: Secondary | ICD-10-CM

## 2019-09-23 NOTE — Patient Instructions (Signed)
Order- schedule HST   Dx OSA   Please call us about 2 weeks after your sleep test, to see if results and recommendations are ready yet. We might choose to start treatment before we get you back.   Ok to try up to 1.5 mg Xanax for sleep if needed  We will look at our options when get you back.

## 2019-09-23 NOTE — Progress Notes (Signed)
09/23/19- 59 yo F former smoker referred for sleep evaluation with difficulty initiating and maintaining sleep. Medical problem list includes COPD GOLD III, Otitis media R, Upper Airway Cough Syndrome. Pulmonary problems followed by Dr Melvyn Novas. Med list includes Xanax 1 mg at hs, albuterol hfa, Breztri,  Epworth score 6 Difficulty initiating and maintaining sleep. Doesn't think it is stimulation from bronchodilators.  Recently told by domestic partner she is snoring more. Weight gain 25 lbs " 2 months". HS 8-10PM, up between 2AM and 7AM. Tried melatonin, ambien, trazodone. Xanax worked initially.  1-2 cups AM coffee.  ENT+ tonsillectomy. Pending echocardiogram. No hx thyroid prob.  Some leg cramps, but not classic restless legs or complex parasomnias.   Prior to Admission medications   Medication Sig Start Date End Date Taking? Authorizing Provider  albuterol (PROVENTIL HFA;VENTOLIN HFA) 108 (90 BASE) MCG/ACT inhaler Inhale 2 puffs into the lungs every 6 (six) hours as needed for wheezing or shortness of breath.   Yes [provider]  ALPRAZolam Duanne Moron) 1 MG tablet Take 1 mg by mouth at bedtime as needed.  09/11/17  Yes [provider]  ascorbic acid (VITAMIN C) 1000 MG tablet Vitamin C 1,000 mg tablet  Take 3 tablets every day by oral route. 06/27/16  Yes [provider]  atorvastatin (LIPITOR) 10 MG tablet Take 5 mg by mouth at bedtime.    Yes [provider]  Budeson-Glycopyrrol-Formoterol (BREZTRI AEROSPHERE) 160-9-4.8 MCG/ACT AERO Inhale 2 puffs into the lungs 2 (two) times daily. 08/30/19  Yes Tanda Rockers, MD  famotidine (PEPCID) 20 MG tablet One after supper 08/30/19  Yes Tanda Rockers, MD  morphine (MSIR) 15 MG tablet Take by mouth. 07/10/19  Yes [provider]  ondansetron (ZOFRAN) 4 mg TABS tablet  07/10/19  Yes [provider]  pantoprazole (PROTONIX) 40 MG tablet Take 1 tablet (40 mg total) by mouth daily. Take 30-60 min before first  meal of the day 08/30/19  Yes Wert, Christena Deem, MD  Vitamin D, Ergocalciferol, (DRISDOL) 1.25 MG (50000 UNIT) CAPS capsule Take 50,000 Units by mouth once a week. 08/26/19  Yes [provider]  Zoster Vaccine Adjuvanted [facility]) injection Shingrix (PF) 50 mcg/0.5 mL intramuscular suspension, kit  ADMINISTER 0.5ML IN THE MUSCLE AS DIRECTED   Yes [provider]   Past Medical History:  Diagnosis Date  . Arthritis    spine- degenerative   . COPD (chronic obstructive pulmonary disease) ([facility])    CXR- 05/2016  . Hyperlipidemia   . 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 Pre-diabetes    pt. reports last A1C - 5/5, had been higher at one time    Past Surgical History:  Procedure Laterality Date  . [place]  . DILATION AND CURETTAGE OF UTERUS     D&C  . LUMBAR LAMINECTOMY/DECOMPRESSION MICRODISCECTOMY N/A 06/15/2016   Procedure: L4-5, L5-S1 Decompression 2 LEVELS, InSitu FUSION L4-L5;  Surgeon: Melina Schools, MD;  Location: [facility];  Service: Orthopedics;  Laterality: N/A;  . TONSILLECTOMY    . WISDOM TOOTH EXTRACTION     done in the hosp., done for impaction    Family History  Problem Relation Age of Onset  . Emphysema Mother        smoked  . Heart disease Mother   . Breast cancer Mother        with mets to bone  . Heart disease Father    Social History  Socioeconomic History  . Marital status: Single    Spouse name: Not on file  . Number of children: Not on file  . Years of education: Not on file  . Highest education level: Not on file  Occupational History  . Occupation: Radio producer  Tobacco Use  . Smoking status: Former Smoker    Packs/day: 1.00    Years: 35.00    Pack years: 35.00    Quit date: 04/13/2019    Years since quitting: 0.4  . Smokeless tobacco: Never Used  Substance and Sexual Activity  . Alcohol use: Yes    Alcohol/week: 0.0 standard drinks    Comment: social- one per  month   . Drug use: No  . Sexual activity: Not on file  Other Topics Concern  . Not on file  Social History Narrative  . Not on file   Social Determinants of Health   Financial Resource Strain:   . Difficulty of Paying Living Expenses:   Food Insecurity:   . Worried About Charity fundraiser in the Last Year:   . Arboriculturist in the Last Year:   Transportation Needs:   . Film/video editor (Medical):   Marland Kitchen Lack of Transportation (Non-Medical):   Physical Activity:   . Days of Exercise per Week:   . Minutes of Exercise per Session:   Stress:   . Feeling of Stress :   Social Connections:   . Frequency of Communication with Friends and Family:   . Frequency of Social Gatherings with Friends and Family:   . Attends Religious Services:   . Active Member of Clubs or Organizations:   . Attends Archivist Meetings:   Marland Kitchen Marital Status:   Intimate Partner Violence:   . Fear of Current or Ex-Partner:   . Emotionally Abused:   Marland Kitchen Physically Abused:   . Sexually Abused:    ROS-see HPI   + = positive Constitutional:    weight loss, night sweats, fevers, chills, fatigue, lassitude. HEENT:    headaches, difficulty swallowing, tooth/dental problems, sore throat,       sneezing, itching, ear ache, nasal congestion, post nasal drip, snoring CV:    chest pain, orthopnea, PND, swelling in lower extremities, anasarca,                                  dizziness, palpitations Resp:   +shortness of breath with exertion or at rest.                productive cough,   non-productive cough, coughing up of blood.              change in color of mucus.  wheezing.   Skin:    rash or lesions. GI:  No-   heartburn, indigestion, abdominal pain, nausea, vomiting, diarrhea,                 change in bowel habits, loss of appetite GU: dysuria, change in color of urine, no urgency or frequency.   flank pain. MS:   joint pain, stiffness, decreased range of motion, back pain. Neuro-     nothing  unusual Psych:  change in mood or affect.  depression or anxiety.   memory loss.  OBJ- Physical Exam General- Alert, Oriented, Affect-appropriate, Distress- none acute Skin- rash-none, lesions- none, excoriation- none Lymphadenopathy- none Head- atraumatic  Eyes- Gross vision intact, PERRLA, conjunctivae and secretions clear            Ears- Hearing, canals-normal            Nose- Clear, no-Septal dev, mucus, polyps, erosion, perforation             Throat- Mallampati II , mucosa clear , drainage- none, tonsils- atrophic Neck- flexible , trachea midline, no stridor , thyroid nl, carotid no bruit Chest - symmetrical excursion , unlabored           Heart/CV- RRR , no murmur , no gallop  , no rub, nl s1 s2                           - JVD- none , edema- none, stasis changes- none, varices- none           Lung- clear to P&A, wheeze- none, cough- none , dullness-none, rub- none           Chest wall-  Abd-  Br/ Gen/ Rectal- Not done, not indicated Extrem- cyanosis- none, clubbing, none, atrophy- none, strength- nl Neuro- grossly intact to observation

## 2019-09-26 ENCOUNTER — Ambulatory Visit
Admission: RE | Admit: 2019-09-26 | Discharge: 2019-09-26 | Disposition: A | Discharge status: Home / Self Care | Payer: 59 | Source: Ambulatory Visit | Attending: Physician Assistant | Admitting: Physician Assistant

## 2019-09-26 DIAGNOSIS — R252 Cramp and spasm: Secondary | ICD-10-CM

## 2019-10-01 ENCOUNTER — Telehealth (HOSPITAL_COMMUNITY): Payer: Self-pay | Admitting: *Deleted

## 2019-10-01 NOTE — Telephone Encounter (Signed)
Called to remind patient of her appointment for walk test/orientation to pulmonary rehab tomorrow, 10/02/2019 @0900 .  Patient confirmed appointment.

## 2019-10-01 NOTE — Assessment & Plan Note (Addendum)
Emphasized sleep hygiene. Recent weight gain and snoring- will rule out significant OSA with sleep study as discussed.Then consider options for her primary complaint of insomnia.  For now we will let her revisit her experience with xanax, for discussion at future visit.

## 2019-10-01 NOTE — Assessment & Plan Note (Signed)
She is not recognizing any impact of her breathing on sleep quality.  Plan- watch for stimulant effect of bronchodilators. Continue current regimen per Dr Sherene Sires.

## 2019-10-02 ENCOUNTER — Other Ambulatory Visit: Payer: Self-pay

## 2019-10-02 ENCOUNTER — Encounter (HOSPITAL_COMMUNITY)
Admission: RE | Admit: 2019-10-02 | Discharge: 2019-10-02 | Disposition: A | Discharge status: Home / Self Care | Payer: 59 | Source: Ambulatory Visit | Attending: Internal Medicine | Admitting: Internal Medicine

## 2019-10-02 VITALS — BP 110/66 | HR 87 | Temp 95.9°F | Ht 61.0 in | Wt 152.1 lb

## 2019-10-02 DIAGNOSIS — J449 Chronic obstructive pulmonary disease, unspecified: Secondary | ICD-10-CM | POA: Insufficient documentation

## 2019-10-02 NOTE — Progress Notes (Signed)
Ann Richardson 59 y.o. female Pulmonary Rehab Orientation Note Patient arrived today in [facility] for orientation to Pulmonary Rehab. [facility] with moderate shortness of breath and no supplemental oxygen. She does not carry portable oxygen. Per pt, she uses oxygen never. Color good, skin warm and dry. Patient is oriented to time and place. Patient's medical history, psychosocial health, and medications reviewed. Psychosocial assessment reveals pt lives with their partner. 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  Pt does not exhibit signs of depression. PHQ2/9 score 0/0. Pt shows good  coping skills with positive outlook . Will continue to monitor and evaluate progress toward psychosocial goal(s) of mental well being while participating in pulmonary rehab. Physical assessment reveals heart rate is normal. Patient reports she does take medications as prescribed. Patient states she follows a Regular diet. The patient reports no specific efforts to gain or lose weight.. Patient's weight will be monitored closely. Demonstration and practice of PLB using pulse oximeter. Patient able to return demonstration satisfactorily. Safety and hand hygiene in the exercise area reviewed with patient. Patient voices understanding of the information reviewed. Department expectations discussed with patient and achievable goals were set. The patient shows enthusiasm about attending the program and we look forward to working with this nice lady. The patient completed a 6 min walk test today and to begin exercise on 10/08/2019 in the 1100 exercise slot.  [id number]

## 2019-10-02 NOTE — Progress Notes (Signed)
Pulmonary Individual Treatment Plan  Patient Details  Name: Ann Richardson MRN: [id number] Date of Birth: Nov 21, 1960 Referring Provider:     Pulmonary Rehab Walk Test from 10/02/2019 in [place]  Referring Provider  Dr. 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  albuterol (PROVENTIL HFA;VENTOLIN HFA) 108 (90 BASE) MCG/ACT inhaler, Inhale 2 puffs into the lungs every 6 (six) hours as needed for wheezing or shortness of breath., Disp: , Rfl:  .  ALPRAZolam (XANAX) 1 MG tablet, Take 1 mg by mouth at bedtime as needed. , Disp: , Rfl:  .  ascorbic acid (VITAMIN C) 1000 MG tablet, Vitamin C 1,000 mg tablet  Take 3 tablets every day by oral route., Disp: , Rfl:  .  atorvastatin (LIPITOR) 10 MG tablet, Take 5 mg by mouth at bedtime. , Disp: , Rfl:  .  Budeson-Glycopyrrol-Formoterol (BREZTRI AEROSPHERE) 160-9-4.8 MCG/ACT AERO, Inhale 2 puffs into the lungs 2 (two) times daily., Disp: 10.7 g, Rfl: 11 .  famotidine (PEPCID) 20 MG tablet, One after supper, Disp: 30 tablet, Rfl: 11 .  morphine (MSIR) 15 MG tablet, Take by mouth., Disp: , Rfl:  .  ondansetron (ZOFRAN) 4 mg TABS tablet, , Disp: , Rfl:  .  pantoprazole (PROTONIX) 40 MG tablet, Take 1 tablet (40 mg total) by mouth daily. Take 30-60 min before first meal of the day, Disp: 30 tablet, Rfl: 2 .  Vitamin D, Ergocalciferol, (DRISDOL) 1.25 MG (50000 UNIT) CAPS capsule, Take 50,000 Units by mouth once a week., Disp: , Rfl:  .  Zoster Vaccine Adjuvanted (SHINGRIX) injection, Shingrix (PF) 50 mcg/0.5 mL intramuscular suspension, kit  ADMINISTER 0.5ML IN THE MUSCLE AS DIRECTED, Disp: , Rfl:   Past Medical History: Past Medical History:  Diagnosis Date   . Arthritis    spine- degenerative   . COPD (chronic obstructive pulmonary disease) ([place])    CXR- 05/2016  . Hyperlipidemia   . 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 Pre-diabetes    pt. reports last A1C - 5/5, had been higher at one time     Tobacco Use: Social History   Tobacco Use  Smoking Status Former Smoker  . Packs/day: 1.00  . Years: 35.00  . Pack years: 35.00  . Quit date: 04/13/2019  . Years since quitting: 0.4  Smokeless Tobacco Never Used    Labs: Recent Review Scientist, physiological    Labs for ITP Cardiac and Pulmonary Rehab Latest Ref Rng & Units 06/09/2016   Hemoglobin A1c 4.8 - 5.6 % 5.4      Capillary Blood Glucose: No results found for: GLUCAP   Pulmonary Assessment Scores: Pulmonary Assessment Scores    Row Name 10/02/19 0954 10/02/19 1042       ADL UCSD   ADL Phase  Entry  Entry    SOB Score total  45  --      CAT Score   CAT Score  15  --      mMRC Score   mMRC Score  --  3      UCSD: Self-administered rating of dyspnea associated with activities of daily living (ADLs) 6-point scale (0 = "not at  all" to 5 = "maximal or unable to do because of breathlessness")  Scoring Scores range from 0 to 120.  Minimally important difference is 5 units  CAT: CAT can identify the health impairment of COPD patients and is better correlated with disease progression.  CAT has a scoring range of zero to 40. The CAT score is classified into four groups of low (less than 10), medium (10 - 20), high (21-30) and very high (31-40) based on the impact level of disease on health status. A CAT score over 10 suggests significant symptoms.  A worsening CAT score could be explained by an exacerbation, poor medication adherence, poor inhaler technique, or progression of COPD or comorbid conditions.  CAT MCID is 2 points  mMRC: mMRC (Modified Medical Research Council) Dyspnea Scale is used to assess the degree of baseline functional disability  in patients of respiratory disease due to dyspnea. No minimal important difference is established. A decrease in score of 1 point or greater is considered a positive change.   Pulmonary Function Assessment:   Exercise Target Goals: Exercise Program Goal: Individual exercise prescription set using results from initial 6 min walk test and THRR while considering  patient's activity barriers and safety.   Exercise Prescription Goal: Initial exercise prescription builds to 30-45 minutes a day of aerobic activity, 2-3 days per week.  Home exercise guidelines will be given to patient during program as part of exercise prescription that the participant will acknowledge.  Activity Barriers & Risk Stratification: Activity Barriers & Cardiac Risk Stratification - 10/02/19 0950      Activity Barriers & Cardiac Risk Stratification   Activity Barriers  Arthritis;Back Problems;Muscular Weakness;Deconditioning;Shortness of Breath       6 Minute Walk: 6 Minute Walk    Row Name 10/02/19 1043         6 Minute Walk   Phase  Initial     Distance  1010 feet     Walk Time  6 minutes     # of Rest Breaks  0     MPH  1.91     METS  2.89     RPE  12     Perceived Dyspnea   3     VO2 Peak  10.13     Symptoms  No     Resting HR  83 bpm     Resting BP  110/66     Resting Oxygen Saturation   93 %     Exercise Oxygen Saturation  during 6 min walk  88 %     Max Ex. HR  111 bpm     Max Ex. BP  134/62     2 Minute Post BP  110/64       Interval HR   1 Minute HR  100     2 Minute HR  103     3 Minute HR  108     4 Minute HR  108     5 Minute HR  105     6 Minute HR  111     2 Minute Post HR  103     Interval Heart Rate?  Yes       Interval Oxygen   Interval Oxygen?  Yes     Baseline Oxygen Saturation %  93 %     1 Minute Oxygen Saturation %  91 %     1 Minute Liters of Oxygen  0 L     2 Minute  Oxygen Saturation %  88 %     2 Minute Liters of Oxygen  0 L     3 Minute Oxygen Saturation %   90 %     3 Minute Liters of Oxygen  0 L     4 Minute Oxygen Saturation %  88 %     4 Minute Liters of Oxygen  0 L     5 Minute Oxygen Saturation %  90 %     5 Minute Liters of Oxygen  0 L     6 Minute Oxygen Saturation %  88 %     6 Minute Liters of Oxygen  0 L     2 Minute Post Oxygen Saturation %  94 %     2 Minute Post Liters of Oxygen  0 L        Oxygen Initial Assessment: Oxygen Initial Assessment - 10/02/19 1042      Home Oxygen   Home Oxygen Device  None    Sleep Oxygen Prescription  None    Home Exercise Oxygen Prescription  None    Home at Rest Exercise Oxygen Prescription  None    Compliance with Home Oxygen Use  Yes      Initial 6 min Walk   Oxygen Used  None      Program Oxygen Prescription   Program Oxygen Prescription  None      Intervention   Short Term Goals  To learn and exhibit compliance with exercise, home and travel O2 prescription;To learn and understand importance of monitoring SPO2 with pulse oximeter and demonstrate accurate use of the pulse oximeter.;To learn and understand importance of maintaining oxygen saturations>88%;To learn and demonstrate proper pursed lip breathing techniques or other breathing techniques.;To learn and demonstrate proper use of respiratory medications    Long  Term Goals  Exhibits compliance with exercise, home and travel O2 prescription;Verbalizes importance of monitoring SPO2 with pulse oximeter and return demonstration;Maintenance of O2 saturations>88%;Exhibits proper breathing techniques, such as pursed lip breathing or other method taught during program session;Compliance with respiratory medication;Demonstrates proper use of MDI's       Oxygen Re-Evaluation:   Oxygen Discharge (Final Oxygen Re-Evaluation):   Initial Exercise Prescription: Initial Exercise Prescription - 10/02/19 1000      Date of Initial Exercise RX and Referring Provider   Date  10/02/19    Referring Provider  Dr. Melvyn Novas      Treadmill   MPH   1.8    Grade  1    Minutes  15      NuStep   Level  3    SPM  80    Minutes  15      Prescription Details   Frequency (times per week)  2    Duration  Progress to 30 minutes of continuous aerobic without signs/symptoms of physical distress      Intensity   THRR 40-80% of Max Heartrate  64-128    Ratings of Perceived Exertion  11-13    Perceived Dyspnea  0-4      Progression   Progression  Continue progressive overload as per policy without signs/symptoms or physical distress.      Resistance Training   Training Prescription  Yes    Weight  orange bands    Reps  10-15       Perform Capillary Blood Glucose checks as needed.  Exercise Prescription Changes:   Exercise Comments:   Exercise Goals and Review: Exercise Goals  [place] Name 10/02/19 1142             Exercise Goals   Increase Physical Activity  Yes       Intervention  Provide advice, education, support and counseling about physical activity/exercise needs.;Develop an individualized exercise prescription for aerobic and resistive training based on initial evaluation findings, risk stratification, comorbidities and participant's personal goals.       Expected Outcomes  Short Term: Attend rehab on a regular basis to increase amount of physical activity.;Long Term: Add in home exercise to make exercise part of routine and to increase amount of physical activity.;Long Term: Exercising regularly at least 3-5 days a week.       Increase Strength and Stamina  Yes       Intervention  Provide advice, education, support and counseling about physical activity/exercise needs.;Develop an individualized exercise prescription for aerobic and resistive training based on initial evaluation findings, risk stratification, comorbidities and participant's personal goals.       Expected Outcomes  Short Term: Increase workloads from initial exercise prescription for resistance, speed, and METs.;Short Term: Perform resistance training  exercises routinely during rehab and add in resistance training at home;Long Term: Improve cardiorespiratory fitness, muscular endurance and strength as measured by increased METs and functional capacity (6MWT)       Able to understand and use rate of perceived exertion (RPE) scale  Yes       Intervention  Provide education and explanation on how to use RPE scale       Expected Outcomes  Short Term: Able to use RPE daily in rehab to express subjective intensity level;Long Term:  Able to use RPE to guide intensity level when exercising independently       Able to understand and use Dyspnea scale  Yes       Intervention  Provide education and explanation on how to use Dyspnea scale       Expected Outcomes  Short Term: Able to use Dyspnea scale daily in rehab to express subjective sense of shortness of breath during exertion;Long Term: Able to use Dyspnea scale to guide intensity level when exercising independently       Knowledge and understanding of Target Heart Rate Range (THRR)  Yes       Intervention  Provide education and explanation of THRR including how the numbers were predicted and where they are located for reference       Expected Outcomes  Short Term: Able to state/look up THRR;Short Term: Able to use daily as guideline for intensity in rehab;Long Term: Able to use THRR to govern intensity when exercising independently       Understanding of Exercise Prescription  Yes       Intervention  Provide education, explanation, and written materials on patient's individual exercise prescription       Expected Outcomes  Short Term: Able to explain program exercise prescription;Long Term: Able to explain home exercise prescription to exercise independently          Exercise Goals Re-Evaluation :   Discharge Exercise Prescription (Final Exercise Prescription Changes):   Nutrition:  Target Goals: Understanding of nutrition guidelines, daily intake of sodium <1532m, cholesterol <2077m calories  30% from fat and 7% or less from saturated fats, daily to have 5 or more servings of fruits and vegetables.  Biometrics: Pre Biometrics - 10/02/19 0951      Pre Biometrics   Height  5' 1" (1.549 m)    Weight  69 kg  BMI (Calculated)  28.76    Grip Strength  20 kg        Nutrition Therapy Plan and Nutrition Goals:   Nutrition Assessments:   Nutrition Goals Re-Evaluation:   Nutrition Goals Discharge (Final Nutrition Goals Re-Evaluation):   Psychosocial: Target Goals: Acknowledge presence or absence of significant depression and/or stress, maximize coping skills, provide positive support system. Participant is able to verbalize types and ability to use techniques and skills needed for reducing stress and depression.  Initial Review & Psychosocial Screening: Initial Psych Review & Screening - 10/02/19 0955      Initial Review   Current issues with  None Identified      Family Dynamics   Good Support System?  Yes      Barriers   Psychosocial barriers to participate in program  There are no identifiable barriers or psychosocial needs.      Screening Interventions   Interventions  Encouraged to exercise       Quality of Life Scores:  Scores of 19 and below usually indicate a poorer quality of life in these areas.  A difference of  2-3 points is a clinically meaningful difference.  A difference of 2-3 points in the total score of the Quality of Life Index has been associated with significant improvement in overall quality of life, self-image, physical symptoms, and general health in studies assessing change in quality of life.  PHQ-9: Recent Review Flowsheet Data    Depression screen [facility] 2/9 10/02/2019   Decreased Interest 0   Down, Depressed, Hopeless 0   PHQ - 2 Score 0   Altered sleeping 0   Tired, decreased energy 0   Change in appetite 0   Feeling bad or failure about yourself  0   Trouble concentrating 0   Moving slowly or fidgety/restless 0   Suicidal  thoughts 0   Difficult doing work/chores Not difficult at all     Interpretation of Total Score  Total Score Depression Severity:  1-4 = Minimal depression, 5-9 = Mild depression, 10-14 = Moderate depression, 15-19 = Moderately severe depression, 20-27 = Severe depression   Psychosocial Evaluation and Intervention: Psychosocial Evaluation - 10/02/19 1002      Psychosocial Evaluation & Interventions   Interventions  Encouraged to exercise with the program and follow exercise prescription    Comments  No psychosocial concerns identified.    Continue Psychosocial Services   No Follow up required       Psychosocial Re-Evaluation:   Psychosocial Discharge (Final Psychosocial Re-Evaluation):   Education: Education Goals: Education classes will be provided on a weekly basis, covering required topics. Participant will state understanding/return demonstration of topics presented.  Learning Barriers/Preferences: Learning Barriers/Preferences - 10/02/19 1004      Learning Barriers/Preferences   Learning Barriers  None    Learning Preferences  Computer/Internet;Written Material       Education Topics: Risk Factor Reduction:  -Group instruction that is supported by a PowerPoint presentation. Instructor discusses the definition of a risk factor, different risk factors for pulmonary disease, and how the heart and lungs work together.     Nutrition for Pulmonary Patient:  -Group instruction provided by PowerPoint slides, verbal discussion, and written materials to support subject matter. The instructor gives an explanation and review of healthy diet recommendations, which includes a discussion on weight management, recommendations for fruit and vegetable consumption, as well as protein, fluid, caffeine, fiber, sodium, sugar, and alcohol. Tips for eating when patients are short of breath are discussed.  Pursed Lip Breathing:  -Group instruction that is supported by demonstration and  informational handouts. Instructor discusses the benefits of pursed lip and diaphragmatic breathing and detailed demonstration on how to preform both.     Oxygen Safety:  -Group instruction provided by PowerPoint, verbal discussion, and written material to support subject matter. There is an overview of "What is Oxygen" and "Why do we need it".  Instructor also reviews how to create a safe environment for oxygen use, the importance of using oxygen as prescribed, and the risks of noncompliance. There is a brief discussion on traveling with oxygen and resources the patient may utilize.   Oxygen Equipment:  -Group instruction provided by [facility] Staff utilizing handouts, written materials, and equipment demonstrations.   Signs and Symptoms:  -Group instruction provided by written material and verbal discussion to support subject matter. Warning signs and symptoms of infection, stroke, and heart attack are reviewed and when to call the physician/911 reinforced. Tips for preventing the spread of infection discussed.   Advanced Directives:  -Group instruction provided by verbal instruction and written material to support subject matter. Instructor reviews Advanced Directive laws and proper instruction for filling out document.   Pulmonary Video:  -Group video education that reviews the importance of medication and oxygen compliance, exercise, good nutrition, pulmonary hygiene, and pursed lip and diaphragmatic breathing for the pulmonary patient.   Exercise for the Pulmonary Patient:  -Group instruction that is supported by a PowerPoint presentation. Instructor discusses benefits of exercise, core components of exercise, frequency, duration, and intensity of an exercise routine, importance of utilizing pulse oximetry during exercise, safety while exercising, and options of places to exercise outside of rehab.     Pulmonary Medications:  -Verbally interactive group education provided by  instructor with focus on inhaled medications and proper administration.   Anatomy and Physiology of the Respiratory System and Intimacy:  -Group instruction provided by PowerPoint, verbal discussion, and written material to support subject matter. Instructor reviews respiratory cycle and anatomical components of the respiratory system and their functions. Instructor also reviews differences in obstructive and restrictive respiratory diseases with examples of each. Intimacy, Sex, and Sexuality differences are reviewed with a discussion on how relationships can change when diagnosed with pulmonary disease. Common sexual concerns are reviewed.   MD DAY -A group question and answer session with a medical doctor that allows participants to ask questions that relate to their pulmonary disease state.   OTHER EDUCATION -Group or individual verbal, written, or video instructions that support the educational goals of the pulmonary rehab program.   Holiday Eating Survival Tips:  -Group instruction provided by PowerPoint slides, verbal discussion, and written materials to support subject matter. The instructor gives patients tips, tricks, and techniques to help them not only survive but enjoy the holidays despite the onslaught of food that accompanies the holidays.   Knowledge Questionnaire Score: Knowledge Questionnaire Score - 10/02/19 1004      Knowledge Questionnaire Score   Pre Score  16/18       Core Components/Risk Factors/Patient Goals at Admission: Personal Goals and Risk Factors at Admission - 10/02/19 1006      Core Components/Risk Factors/Patient Goals on Admission   Improve shortness of breath with ADL's  Yes    Intervention  Provide education, individualized exercise plan and daily activity instruction to help decrease symptoms of SOB with activities of daily living.    Expected Outcomes  Short Term: Improve cardiorespiratory fitness to achieve a reduction of symptoms when  performing ADLs;Long Term: Be able to perform more ADLs without symptoms or delay the onset of symptoms       Core Components/Risk Factors/Patient Goals Review:    Core Components/Risk Factors/Patient Goals at Discharge (Final Review):    ITP Comments:   Comments:

## 2019-10-04 ENCOUNTER — Ambulatory Visit (HOSPITAL_COMMUNITY): Payer: 59 | Attending: Cardiology

## 2019-10-04 ENCOUNTER — Other Ambulatory Visit: Payer: Self-pay

## 2019-10-04 DIAGNOSIS — J449 Chronic obstructive pulmonary disease, unspecified: Secondary | ICD-10-CM | POA: Diagnosis present

## 2019-10-08 ENCOUNTER — Encounter (HOSPITAL_COMMUNITY)
Admission: RE | Admit: 2019-10-08 | Discharge: 2019-10-08 | Disposition: A | Discharge status: Home / Self Care | Payer: 59 | Source: Ambulatory Visit | Attending: Internal Medicine | Admitting: Internal Medicine

## 2019-10-08 ENCOUNTER — Other Ambulatory Visit: Payer: Self-pay

## 2019-10-08 DIAGNOSIS — J449 Chronic obstructive pulmonary disease, unspecified: Secondary | ICD-10-CM | POA: Diagnosis present

## 2019-10-08 NOTE — Progress Notes (Signed)
Spoke with pt and notified of results per Brian. Pt verbalized understanding and denied any questions. °

## 2019-10-08 NOTE — Progress Notes (Signed)
Daily Session Note  Patient Details  Name: Ann Richardson MRN: [id number] Date of Birth: 02-20-1961 Referring Provider:     Pulmonary Rehab Walk Test from 10/02/2019 in [place]  Referring Provider  Dr. Melvyn Novas      Encounter Date: 10/08/2019  Check In: Session Check In - 10/08/19 1048      Check-In   Supervising physician immediately available to respond to emergencies  [facility] immediately available    Physician(s)  Dr. Doristine Bosworth    Location  [facility]    Staff Present  Rosebud Poles, RN, Bjorn Loser, MS, Exercise Physiologist;Lisa Ysidro Evert, RN    Virtual Visit  No    Medication changes reported      No    Fall or balance concerns reported     No    Tobacco Cessation  No Change    Warm-up and Cool-down  Performed as group-led instruction    Resistance Training Performed  Yes    VAD Patient?  No    PAD/SET Patient?  No      Pain Assessment   Currently in Pain?  No/denies    Multiple Pain Sites  No       Capillary Blood Glucose: No results found for this or any previous visit (from the past 24 hour(s)).    Social History   Tobacco Use  Smoking Status Former Smoker  . Packs/day: 1.00  . Years: 35.00  . Pack years: 35.00  . Quit date: 04/13/2019  . Years since quitting: 0.4  Smokeless Tobacco Never Used    Goals Met:  Proper associated with RPD/PD & O2 Sat Exercise tolerated well Strength training completed today  Goals Unmet:  Not Applicable  Comments: Service time is from 1040 to 1145    Dr. Fransico Him is Medical Director for Cardiac Rehab at [facility].

## 2019-10-10 ENCOUNTER — Other Ambulatory Visit: Payer: Self-pay

## 2019-10-10 ENCOUNTER — Encounter (HOSPITAL_COMMUNITY)
Admission: RE | Admit: 2019-10-10 | Discharge: 2019-10-10 | Disposition: A | Discharge status: Home / Self Care | Payer: 59 | Source: Ambulatory Visit | Attending: Internal Medicine | Admitting: Internal Medicine

## 2019-10-10 VITALS — Ht 61.0 in | Wt 152.0 lb

## 2019-10-10 DIAGNOSIS — J449 Chronic obstructive pulmonary disease, unspecified: Secondary | ICD-10-CM

## 2019-10-10 NOTE — Progress Notes (Signed)
Ann Richardson 59 y.o. 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 Arthritis    spine- degenerative   . COPD (chronic obstructive pulmonary disease) ([place])    CXR- 05/2016  . Hyperlipidemia   . 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 Pre-diabetes    pt. reports last A1C - 5/5, had been higher at one time      Medications reviewed.   Current Outpatient Medications:  .  albuterol (PROVENTIL HFA;VENTOLIN HFA) 108 (90 BASE) MCG/ACT inhaler, Inhale 2 puffs into the lungs every 6 (six) hours as needed for wheezing or shortness of breath., Disp: , Rfl:  .  ALPRAZolam (XANAX) 1 MG tablet, Take 1 mg by mouth at bedtime as needed. , Disp: , Rfl:  .  ascorbic acid (VITAMIN C) 1000 MG tablet, Vitamin C 1,000 mg tablet  Take 3 tablets every day by oral route., Disp: , Rfl:  .  atorvastatin (LIPITOR) 10 MG tablet, Take 5 mg by mouth at bedtime. , Disp: , Rfl:  .  Budeson-Glycopyrrol-Formoterol (BREZTRI AEROSPHERE) 160-9-4.8 MCG/ACT AERO, Inhale 2 puffs into the lungs 2 (two) times daily., Disp: 10.7 g, Rfl: 11 .  famotidine (PEPCID) 20 MG tablet, One after supper, Disp: 30 tablet, Rfl: 11 .  morphine (MSIR) 15 MG tablet, Take by mouth., Disp: , Rfl:  .  ondansetron (ZOFRAN) 4 mg TABS tablet, , Disp: , Rfl:  .  pantoprazole (PROTONIX) 40 MG tablet, Take 1 tablet (40 mg total) by mouth daily. Take 30-60 min before first meal of the day, Disp: 30 tablet, Rfl: 2 .  Vitamin D, Ergocalciferol, (DRISDOL) 1.25 MG (50000 UNIT) CAPS capsule, Take 50,000 Units by mouth once a week., Disp: , Rfl:  .  Zoster Vaccine Adjuvanted (SHINGRIX) injection, Shingrix (PF) 50 mcg/0.5 mL intramuscular suspension, kit  ADMINISTER 0.5ML IN THE MUSCLE AS DIRECTED, Disp: , Rfl:    Ht Readings from Last 1 Encounters:  10/02/19 '5\' 1"'$  (1.549 m)     Wt Readings from Last 3 Encounters:  10/02/19  152 lb 1.9 oz (69 kg)  09/23/19 154 lb (69.9 kg)  09/17/19 150 lb (68 kg)     There is no height or weight on file to calculate BMI.   Social History   Tobacco Use  Smoking Status Former Smoker  . Packs/day: 1.00  . Years: 35.00  . Pack years: 35.00  . Quit date: 04/13/2019  . Years since quitting: 0.4  Smokeless Tobacco Never Used    Nutrition Note  Spoke with pt. Nutrition Plan and Nutrition Survey goals reviewed with pt.   Pt was diagnosed with pre diabetes years ago. She has worked to keep her A1C below goal. She has made diet changes, exercised, and quit smoking.  Pt brought up discussion about weight loss. She reports weight history. Prior to pre diabetes diagnosis, she weighed around 145 lbs. She started decreasing her carbohydrates and lost about 20 lbs. She maintained that weight for years. She has gained 20 lbs since mid January. She feels this must be related to her onset of sedentary activities. She was exercising often with her girlfriend until she began having increased difficulty breathing, Her goal is to lose 15 lbs as she gets back into an exercise routine. She has made modifications to her diet. Her preference is a small breakfast. She typically eats [place]. She has started eating salads for  lunch again. She is avoiding large portions of carbs for dinner.  Pt expressed understanding of the information reviewed.  Nutrition Diagnosis ? Food-and nutrition-related knowledge deficit related to lack of exposure to information as related to diagnosis of: ? CVD ? Pre-diabetes Nutrition Intervention ? Pt's individual nutrition plan reviewed with pt. ? Benefits of adopting healthy diet reviewed with Rate My Plate survey   ? Continue client-centered nutrition education by RD, as part of interdisciplinary care.  Goal(s) ? Pt to identify food quantities necessary to achieve weight loss of 6-24 lb at graduation from cardiac rehab.  ? Pt to build a healthy plate including  vegetables, fruits, whole grains, and low-fat dairy products in a heart healthy meal plan.  Plan:   Will provide client-centered nutrition education as part of interdisciplinary care  Monitor and evaluate progress toward nutrition goal with team.   Michaele Offer, MS, RDN, LDN

## 2019-10-10 NOTE — Progress Notes (Signed)
Daily Session Note  Patient Details  Name: Ann Richardson MRN: [id number] Date of Birth: 09/18/1960 Referring Provider:     Pulmonary Rehab Walk Test from 10/02/2019 in [place]  Referring Provider  Dr. Melvyn Novas      Encounter Date: 10/10/2019  Check In: Session Check In - 10/10/19 1141      Check-In   Supervising physician immediately available to respond to emergencies  [facility] immediately available    Physician(s)  Dr. Erlinda Hong    Location  [facility]    Staff Present  Rosebud Poles, RN, Bjorn Loser, MS, Exercise Physiologist;Gurney Balthazor Ysidro Evert, RN    Virtual Visit  No    Medication changes reported      No    Fall or balance concerns reported     No    Tobacco Cessation  No Change    Warm-up and Cool-down  Performed as group-led instruction    Resistance Training Performed  Yes    VAD Patient?  No    PAD/SET Patient?  No      Pain Assessment   Currently in Pain?  No/denies    Multiple Pain Sites  No       Capillary Blood Glucose: No results found for this or any previous visit (from the past 24 hour(s)).    Social History   Tobacco Use  Smoking Status Former Smoker  . Packs/day: 1.00  . Years: 35.00  . Pack years: 35.00  . Quit date: 04/13/2019  . Years since quitting: 0.4  Smokeless Tobacco Never Used    Goals Met:  Exercise tolerated well No report of cardiac concerns or symptoms Strength training completed today  Goals Unmet:  Not Applicable  Comments: Service time is from 1045 to 1155    Dr. Fransico Him is Medical Director for Cardiac Rehab at [facility].

## 2019-10-11 ENCOUNTER — Ambulatory Visit: Payer: 59

## 2019-10-11 DIAGNOSIS — G4710 Hypersomnia, unspecified: Secondary | ICD-10-CM | POA: Diagnosis not present

## 2019-10-11 DIAGNOSIS — G4733 Obstructive sleep apnea (adult) (pediatric): Secondary | ICD-10-CM

## 2019-10-15 ENCOUNTER — Other Ambulatory Visit: Payer: Self-pay

## 2019-10-15 ENCOUNTER — Encounter (HOSPITAL_COMMUNITY)
Admission: RE | Admit: 2019-10-15 | Discharge: 2019-10-15 | Disposition: A | Discharge status: Home / Self Care | Payer: 59 | Source: Ambulatory Visit | Attending: Internal Medicine | Admitting: Internal Medicine

## 2019-10-15 ENCOUNTER — Ambulatory Visit: Payer: 59 | Admitting: Internal Medicine

## 2019-10-15 VITALS — Temp 96.6°F | Wt 151.9 lb

## 2019-10-15 DIAGNOSIS — J449 Chronic obstructive pulmonary disease, unspecified: Secondary | ICD-10-CM | POA: Diagnosis not present

## 2019-10-15 NOTE — Progress Notes (Signed)
Daily Session Note  Patient Details  Name: Ann Richardson MRN: [id number] Date of Birth: 07/28/1960 Referring Provider:     Pulmonary Rehab Walk Test from 10/02/2019 in [place]  Referring Provider  Dr. Melvyn Novas      Encounter Date: 10/15/2019  Check In: Session Check In - 10/15/19 1112      Check-In   Supervising physician immediately available to respond to emergencies  [facility] immediately available    Physician(s)  Dr. Tawanna Solo    Location  [facility]    Staff Present  Rosebud Poles, RN, Bjorn Loser, MS, Exercise Physiologist;Lisa Ysidro Evert, RN    Virtual Visit  No    Medication changes reported      No    Fall or balance concerns reported     No    Tobacco Cessation  No Change    Warm-up and Cool-down  Performed as group-led instruction    Resistance Training Performed  Yes    VAD Patient?  No    PAD/SET Patient?  No      Pain Assessment   Currently in Pain?  No/denies    Multiple Pain Sites  No       Capillary Blood Glucose: No results found for this or any previous visit (from the past 24 hour(s)).  Exercise Prescription Changes - 10/15/19 1200      Response to Exercise   Blood Pressure (Admit)  140/80    Blood Pressure (Exercise)  158/80    Blood Pressure (Exit)  128/82    Heart Rate (Admit)  100 bpm    Heart Rate (Exercise)  121 bpm    Heart Rate (Exit)  99 bpm    Oxygen Saturation (Admit)  93 %    Oxygen Saturation (Exercise)  89 %    Oxygen Saturation (Exit)  94 %    Rating of Perceived Exertion (Exercise)  12    Perceived Dyspnea (Exercise)  2.5    Duration  Continue with 30 min of aerobic exercise without signs/symptoms of physical distress.    Intensity  --   40-80% HRR     Progression   Progression  Continue to progress workloads to maintain intensity without signs/symptoms of physical distress.      Resistance Training   Training Prescription  Yes    Weight  orange bands    Reps   10-15    Time  10 Minutes      Interval Training   Interval Training  No      Treadmill   MPH  1.8    Grade  0    Minutes  15      NuStep   Level  3    SPM  80    Minutes  15    METs  1.8       Social History   Tobacco Use  Smoking Status Former Smoker  . Packs/day: 1.00  . Years: 35.00  . Pack years: 35.00  . Quit date: 04/13/2019  . Years since quitting: 0.5  Smokeless Tobacco Never Used    Goals Met:  Proper associated with RPD/PD & O2 Sat Exercise tolerated well Strength training completed today  Goals Unmet:  Not Applicable  Comments: Service time is from 1040 to 1140.    Dr. Fransico Him is Medical Director for Cardiac Rehab at [facility].

## 2019-10-15 NOTE — Progress Notes (Signed)
@Patient  ID: Ann Richardson, female    DOB: Jan 10, 1961, 59 y.o.   MRN: [id number]  Chief Complaint  Patient presents with  . Follow-up    Discuss Echo and HST-sob-same,started Pulm. Rehab.    Referring provider: Heywood Bene, *  HPI:  59 year old female current everyday smoker followed in our office for COPD  PMH: Spinal stenosis Smoker/ Smoking History: Former Smoker  Maintenance:  Breztri  Pt of: Dr. Melvyn Novas  10/16/2019  - Visit   59 year old female current everyday smoker followed in our office for COPD.  Patient was last seen in our office in March/2021.  Since last being seen she has completed an echocardiogram as well as had evaluation with a sleep MD due to difficulty initiating sleep.  She was seen by Dr. Annamaria Boots on 09/23/2019.  It was emphasized that she continue to maintain adequate sleep hygiene.  A sleep study was ordered.  Okay to potentially consider titrating up to Xanax of 1.5 mg for sleep as needed.   Patient reports that she completed a home sleep study earlier this week.  She has returned the machine.  She is not received her results yet.  She also has started to work with pulmonary rehab.  She reports that her insurance company is no longer covering her rescue inhaler she needs to specifically have ProAir sent in.  We will discuss this.  She continues to report clinical benefit when using her Breztri inhaler.  Patient continues to have shortness of breath.  She did complete an echocardiogram that did show mildly elevated pulmonary pressures.  We will discuss this today.  Questionaires / Pulmonary Flowsheets:    MMRC: mMRC Dyspnea Scale mMRC Score  10/16/2019 3  09/17/2019 3  09/17/2019 0    Epworth:  Results of the Epworth flowsheet 09/23/2019  Sitting and reading 1  Watching TV 2  Sitting, inactive in a public place (e.g. a theatre or a meeting) 1  As a passenger in a car for an hour without a break 0  Lying down to rest in the afternoon when  circumstances permit 1  Sitting and talking to someone 0  Sitting quietly after a lunch without alcohol 1  In a car, while stopped for a few minutes in traffic 0  Total score 6    Tests:   02/27/2018-CBC with differential eosinophils relative 1.2, eosinophils absolute 0.1  08/31/2019-chest x-ray-COPD, no active cardiopulmonary disease  04/02/2018-CT maxillofacial-mild mucosal edema base of left maxillary sinus, otherwise sinuses are clear  10/30/2017-pulmonary function test-FVC 1.94 (64% predicted), postbronchodilator ratio 51, postbronchodilator FEV1 1.00 (43% predicted), no bronchodilator response, DLCO 8.07 (39% predicted) >>> Patient took Symbicort 160 before this  10/04/2019-echocardiogram-LV ejection fraction 60 to 65%, right ventricle systolic function normal  FENO:  No results found for: NITRICOXIDE  PFT: PFT Results Latest Ref Rng & Units 10/30/2017 01/08/2015  FVC-Pre L 1.94 2.06  FVC-Predicted Pre % 64 67  FVC-Post L 1.98 2.33  FVC-Predicted Post % 66 75  Pre FEV1/FVC % % 53 58  Post FEV1/FCV % % 51 56  FEV1-Pre L 1.03 1.20  FEV1-Predicted Pre % 44 50  FEV1-Post L 1.00 1.31  DLCO UNC% % 39 57  DLCO COR %Predicted % 52 73  TLC L - 4.69  TLC % Predicted % - 101  RV % Predicted % - 132    WALK:  SIX MIN WALK 10/02/2019 09/17/2019  Supplimental Oxygen during Test? (L/min) - No  2 Minute Oxygen Saturation % 88 -  2 Minute HR 103 -  4 Minute Oxygen Saturation % 88 -  4 Minute HR 108 -  6 Minute Oxygen Saturation % 88 -  6 Minute HR 111 -  Tech Comments: - Patient was able to complete 2 laps at a steady pace. No O2 used during or after walk. She did have some slight SOB towards the end of the walk but she did not stop. Denied any chest or leg pain or dizziness.    Imaging: US ARTERIAL SEG MULTIPLE LE (ABI, SEGMENTAL PRESSURES, PVR'S)  Result Date: 09/26/2019 CLINICAL DATA:  Bilateral lower extremity cramping 1 supine, relieved with walking. Previous tobacco abuse. EXAM:  NONINVASIVE PHYSIOLOGIC VASCULAR STUDY OF BILATERAL LOWER EXTREMITIES TECHNIQUE: Non-invasive vascular evaluation of both lower extremities was performed at rest, including calculation of ankle-brachial indices, multiple segmental pressure evaluation, segmental Doppler and segmental pulse volume recording. COMPARISON:  None. FINDINGS: Right Lower Extremity Resting ABI:  1.13 Resting TBI: 0.85 Segmental Pressures: Normal segmental pressures, no significant (20 mmHg) pressure gradient between adjacent segments. Great toe pressure: 115 mmHg Arterial Waveforms: Normal tri-phasic arterial waveforms. PVRs: Normal PVRs with maintained waveform amplitude, augmentation and quality. Left Lower Extremity: Resting ABI: 0.99 Resting TBI: 0.67 Segmental Pressures: Normal segmental pressures, no significant (20 mmHg) pressure gradient between adjacent segments. Great toe pressure: 91 mmHg Arterial Waveforms: Normal tri-phasic arterial waveforms. PVRs: Normal PVRs with maintained waveform amplitude, augmentation and quality. Other: Symmetric upper extremity pressures. IMPRESSION: 1. No evidence of hemodynamically significant right lower extremity arterial occlusive disease at rest. 2. Near normal left ABI. Left great toe pressure sufficient for wound healing. Electronically Signed   By: Lucrezia Europe M.D.   On: 09/26/2019 15:44   ECHOCARDIOGRAM COMPLETE  Result Date: 10/04/2019    ECHOCARDIOGRAM REPORT   Patient Name:   Ann Richardson [facility] Date of Exam: 10/04/2019 Medical Rec #:  [id number]          Height:       61.0 in Accession #:    [id number]         Weight:       152.1 lb Date of Birth:  [birth year]          BSA:          1.681 m Patient Age:    89 years           BP:           112/58 mmHg Patient Gender: F                  HR:           83 bpm. Exam Location:  [address] Procedure: 2D Echo, Cardiac Doppler and Color Doppler Indications:    J44.9 COPD  History:        Patient has no prior history of Echocardiogram examinations.                  COPD; Risk Factors:Former Smoker, Dyslipidemia and Pre-diabetes.  Sonographer:    Jessee Avers, RDCS Referring Phys: [place]  Sonographer Comments: Technically difficult study due to poor echo windows. IMPRESSIONS  1. Left ventricular ejection fraction, by estimation, is 60 to 65%. The left ventricle has normal function. The left ventricle has no regional wall motion abnormalities. There is mild asymmetric left ventricular hypertrophy of the basal-septal segment. Left ventricular diastolic parameters were normal.  2. Right ventricular systolic function is normal. The right ventricular size is normal. There is mildly elevated  pulmonary artery systolic pressure. The estimated right ventricular systolic pressure is 00.8 mmHg.  3. The mitral valve is normal in structure. No evidence of mitral valve regurgitation.  4. The aortic valve was not well visualized. Aortic valve regurgitation is not visualized. Mild to moderate aortic valve sclerosis/calcification is present, without any evidence of aortic stenosis.  5. The inferior vena cava is normal in size with greater than 50% respiratory variability, suggesting right atrial pressure of 3 mmHg. FINDINGS  Left Ventricle: Left ventricular ejection fraction, by estimation, is 60 to 65%. The left ventricle has normal function. The left ventricle has no regional wall motion abnormalities. The left ventricular internal cavity size was normal in size. There is  mild asymmetric left ventricular hypertrophy of the basal-septal segment. Left ventricular diastolic parameters were normal. Right Ventricle: The right ventricular size is normal. No increase in right ventricular wall thickness. Right ventricular systolic function is normal. There is mildly elevated pulmonary artery systolic pressure. The tricuspid regurgitant velocity is 2.74  m/s, and with an assumed right atrial pressure of 3 mmHg, the estimated right ventricular systolic pressure is 67.6  mmHg. Left Atrium: Left atrial size was normal in size. Right Atrium: Right atrial size was normal in size. Pericardium: Trivial pericardial effusion is present. Mitral Valve: The mitral valve is normal in structure. No evidence of mitral valve regurgitation. Tricuspid Valve: The tricuspid valve is normal in structure. Tricuspid valve regurgitation is trivial. Aortic Valve: The aortic valve was not well visualized. Aortic valve regurgitation is not visualized. Mild to moderate aortic valve sclerosis/calcification is present, without any evidence of aortic stenosis. Pulmonic Valve: The pulmonic valve was not well visualized. Pulmonic valve regurgitation is not visualized. Aorta: The aortic root is normal in size and structure. Venous: The inferior vena cava is normal in size with greater than 50% respiratory variability, suggesting right atrial pressure of 3 mmHg. IAS/Shunts: No atrial level shunt detected by color flow Doppler.  LEFT VENTRICLE PLAX 2D LVIDd:         4.10 cm  Diastology LVIDs:         2.60 cm  LV e' lateral:   9.36 cm/s LV PW:         0.60 cm  LV E/e' lateral: 7.1 LV IVS:        0.60 cm  LV e' medial:    8.92 cm/s LVOT diam:     2.10 cm  LV E/e' medial:  7.4 LV SV:         55 LV SV Index:   33 LVOT Area:     3.46 cm  RIGHT VENTRICLE RV Basal diam:  3.10 cm RV S prime:     10.60 cm/s TAPSE (M-mode): 2.3 cm RVSP:           33.0 mmHg LEFT ATRIUM             Index       RIGHT ATRIUM           Index LA diam:        2.90 cm 1.72 cm/m  RA Pressure: 3.00 mmHg LA Vol (A2C):   23.6 ml 14.04 ml/m RA Area:     8.05 cm LA Vol (A4C):   14.0 ml 8.33 ml/m  RA Volume:   13.80 ml  8.21 ml/m LA Biplane Vol: 18.7 ml 11.12 ml/m  AORTIC VALVE LVOT Vmax:   66.60 cm/s LVOT Vmean:  48.400 cm/s LVOT VTI:    0.158 m  AORTA Ao  Root diam: 2.90 cm MITRAL VALVE               TRICUSPID VALVE                            TR Peak grad:   30.0 mmHg                            TR Vmax:        274.00 cm/s MV E velocity: 66.40 cm/s   Estimated RAP:  3.00 mmHg MV A velocity: 72.40 cm/s  RVSP:           33.0 mmHg MV E/A ratio:  0.92                            SHUNTS                            Systemic VTI:  0.16 m                            Systemic Diam: 2.10 cm Oswaldo Milian MD Electronically signed by Oswaldo Milian MD Signature Date/Time: 10/04/2019/10:57:27 AM    Final     Lab Results:  CBC    Component Value Date/Time   WBC 12.6 (H) 09/17/2019 0943   RBC 4.21 09/17/2019 0943   HGB 13.3 09/17/2019 0943   HCT 39.4 09/17/2019 0943   PLT 260.0 09/17/2019 0943   MCV 93.4 09/17/2019 0943   MCH 31.6 06/23/2016 0800   MCHC 33.8 09/17/2019 0943   RDW 12.6 09/17/2019 0943   LYMPHSABS 2.6 09/17/2019 0943   MONOABS 1.0 09/17/2019 0943   EOSABS 0.3 09/17/2019 0943   BASOSABS 0.1 09/17/2019 0943    BMET    Component Value Date/Time   NA 138 09/17/2019 0943   K 4.0 09/17/2019 0943   CL 102 09/17/2019 0943   CO2 30 09/17/2019 0943   GLUCOSE 158 (H) 09/17/2019 0943   BUN 19 09/17/2019 0943   CREATININE 0.72 09/17/2019 0943   CALCIUM 9.8 09/17/2019 0943   GFRNONAA >60 06/23/2016 0800   GFRAA >60 06/23/2016 0800    BNP No results found for: BNP  ProBNP    Component Value Date/Time   PROBNP 16.0 09/17/2019 0943    Specialty Problems      Pulmonary Problems   COPD GOLD III    Followed in Pulmonary clinic/ [facility]/ [place] Quit smoking 03/2019    - 01/08/2015 pfts  FEV1  1.31 (54%) s am meds and ratio 56% p 9% improvement form saba with dlco 57 to 73% p correct for alv vol > rec symb 160 2bid  - alpha one  01/08/2015 > MM - PFT's  10/30/2017  FEV1 1.00 (43 % ) ratio 51  p 0 % improvement from saba p symbicort 160 x 2  prior to study with DLCO  39 % corrects to 52 % for alv volume  - 10/30/2017  After extensive coaching inhaler device  effectiveness =    75% > try bevespi 2bid   02/27/2018- Added Qvar 40 03/29/18  changed to symb 160/spiriva  And added flutter valve 03/2019 - quit smoking   08/30/2019  After extensive coaching inhaler device,  effectiveness =    75% try breztri 2  bid 09/17/2019 - referred to pulmonary rehab         Upper airway cough syndrome    - sinus CT  04/02/2018 Mild mucosal edema base of left maxillary sinus. Otherwise sinuses are clear      Shortness of breath      Allergies  Allergen Reactions  . Other Nausea And Vomiting    Projectile vomiting; oxycodone     Immunization History  Administered Date(s) Administered  . Influenza,inj,Quad PF,6+ Mos 02/20/2019  . Moderna SARS-COVID-2 Vaccination 09/21/2019  . Pneumococcal Polysaccharide-23 08/22/2018  . Tdap 08/22/2018  . Zoster Recombinat (Shingrix) 06/23/2019, 08/22/2019    Past Medical History:  Diagnosis Date  . Arthritis    spine- degenerative   . COPD (chronic obstructive pulmonary disease) ([place])    CXR- 05/2016  . Hyperlipidemia   . 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 Pre-diabetes    pt. reports last A1C - 5/5, had been higher at one time     Tobacco History: Social History   Tobacco Use  Smoking Status Former Smoker  . Packs/day: 1.00  . Years: 35.00  . Pack years: 35.00  . Quit date: 04/13/2019  . Years since quitting: 0.5  Smokeless Tobacco Never Used   Counseling given: Yes   Continue to not smoke  Outpatient Encounter Medications as of 10/16/2019  Medication Sig  . albuterol (PROVENTIL HFA;VENTOLIN HFA) 108 (90 BASE) MCG/ACT inhaler Inhale 2 puffs into the lungs every 6 (six) hours as needed for wheezing or shortness of breath.  . ALPRAZolam (XANAX) 1 MG tablet Take 1 mg by mouth at bedtime as needed.   Marland Kitchen ascorbic acid (VITAMIN C) 1000 MG tablet Vitamin C 1,000 mg tablet  Take 3 tablets every day by oral route.  . Budeson-Glycopyrrol-Formoterol (BREZTRI AEROSPHERE) 160-9-4.8 MCG/ACT AERO Inhale 2 puffs into the lungs 2 (two) times daily.  . famotidine (PEPCID) 20 MG tablet One after supper  . pantoprazole (PROTONIX) 40 MG  tablet Take 1 tablet (40 mg total) by mouth daily. Take 30-60 min before first meal of the day  . Vitamin D, Ergocalciferol, (DRISDOL) 1.25 MG (50000 UNIT) CAPS capsule Take 50,000 Units by mouth once a week.  Marland Kitchen atorvastatin (LIPITOR) 10 MG tablet Take 5 mg by mouth at bedtime.   Marland Kitchen morphine (MSIR) 15 MG tablet Take by mouth.  . ondansetron (ZOFRAN) 4 mg TABS tablet   . Zoster Vaccine Adjuvanted [facility]) injection Shingrix (PF) 50 mcg/0.5 mL intramuscular suspension, kit  ADMINISTER 0.5ML IN THE MUSCLE AS DIRECTED   No facility-administered encounter medications on file as of 10/16/2019.     Review of Systems  Review of Systems  Constitutional: Positive for fatigue. Negative for activity change and fever.  HENT: Negative for sinus pressure, sinus pain and sore throat.   Respiratory: Positive for shortness of breath. Negative for cough and wheezing.   Cardiovascular: Negative for chest pain and palpitations.  Gastrointestinal: Negative for diarrhea, nausea and vomiting.  Musculoskeletal: Negative for arthralgias.  Neurological: Negative for dizziness.  Psychiatric/Behavioral: Negative for sleep disturbance. The patient is not nervous/anxious.      Physical Exam  BP 138/78 (BP Location: Left Arm, Cuff Size: Normal)   Pulse 87   Temp (!) 97.3 F (36.3 C) (Temporal)   Ht 5' 1"  (1.549 m)   Wt 152 lb 12.8 oz (69.3 kg)   SpO2 95%   BMI 28.87 kg/m   Wt Readings from Last 5 Encounters:  10/16/19 152  lb 12.8 oz (69.3 kg)  10/15/19 151 lb 14.4 oz (68.9 kg)  10/10/19 152 lb (68.9 kg)  10/02/19 152 lb 1.9 oz (69 kg)  09/23/19 154 lb (69.9 kg)    BMI Readings from Last 5 Encounters:  10/16/19 28.87 kg/m  10/15/19 28.70 kg/m  10/10/19 28.72 kg/m  10/02/19 28.74 kg/m  09/23/19 30.08 kg/m     Physical Exam Vitals and nursing note reviewed.  Constitutional:      General: She is not in acute distress.    Appearance: Normal appearance. She is normal weight.  HENT:      Head: Normocephalic and atraumatic.     Right Ear: Tympanic membrane, ear canal and external ear normal. There is no impacted cerumen.     Left Ear: Tympanic membrane, ear canal and external ear normal. There is no impacted cerumen.     Nose: Nose normal. No congestion.     Mouth/Throat:     Mouth: Mucous membranes are moist.     Pharynx: Oropharynx is clear.  Eyes:     Pupils: Pupils are equal, round, and reactive to light.  Cardiovascular:     Rate and Rhythm: Normal rate and regular rhythm.     Pulses: Normal pulses.     Heart sounds: Normal heart sounds. No murmur.  Pulmonary:     Effort: Pulmonary effort is normal. No respiratory distress.     Breath sounds: Normal breath sounds. No decreased air movement. No decreased breath sounds, wheezing or rales.  Musculoskeletal:     Cervical back: Normal range of motion.     Right lower leg: No edema.     Left lower leg: No edema.  Skin:    General: Skin is warm and dry.     Capillary Refill: Capillary refill takes less than 2 seconds.  Neurological:     General: No focal deficit present.     Mental Status: She is alert and oriented to person, place, and time. Mental status is at baseline.     Gait: Gait normal.  Psychiatric:        Mood and Affect: Mood normal.        Behavior: Behavior normal.        Thought Content: Thought content normal.        Judgment: Judgment normal.       Assessment & Plan:   Trouble in sleeping Suspected obstructive sleep apnea Completed home sleep study Waiting for results  Shortness of breath Patient continues to have shortness of breath.  Shortness of breath is likely multifactorial.  Pulmonary work-up continues to show severe obstructive lung disease.  She is maintained on adequate inhalers.  Given her smoking history as well as familial risk for CAD, or close chronic disease will refer to cardiology at patient's request.  Plan: Referral to cardiology    COPD GOLD III Reviewed 2019  pulmonary function test today with patient Patient feels her breathing is a little bit better on Breztri inhaler Potential for having pulmonary hypertension based off of last chest x-ray which was clear COPD stage III based off of May/2019 pulmonary function test Quit smoking in October/2020 Continues to have ongoing dyspnea, recent weight increase over the last 2 to 3 months  Plan: Continue pulmonary rehab Continue Breztri  8-week follow-up in our office with Dr. Melvyn Novas  Pulmonary HTN [facility]) Pending home sleep study Severe COPD on 2019 pulmonary function testing Mildly elevated pulmonary pressures on 2021 echocardiogram  Plan: Continue to clinically monitor Optimize  pulmonary management We will await home sleep study results Referral to cardiology for further evaluation of dyspnea on exertion    Return in about 2 months (around 12/16/2019), or if symptoms worsen or fail to improve, for Follow up with Dr. Melvyn Novas.   Lauraine Rinne, NP 10/16/2019   This appointment required 32 minutes of patient care (this includes precharting, chart review, review of results, face-to-face care, etc.).

## 2019-10-16 ENCOUNTER — Encounter: Payer: Self-pay | Admitting: Cardiovascular Disease

## 2019-10-16 ENCOUNTER — Encounter: Payer: Self-pay | Admitting: Pulmonary Disease

## 2019-10-16 ENCOUNTER — Ambulatory Visit (INDEPENDENT_AMBULATORY_CARE_PROVIDER_SITE_OTHER): Payer: 59 | Admitting: Pulmonary Disease

## 2019-10-16 VITALS — BP 138/78 | HR 87 | Temp 97.3°F | Ht 61.0 in | Wt 152.8 lb

## 2019-10-16 DIAGNOSIS — G479 Sleep disorder, unspecified: Secondary | ICD-10-CM | POA: Diagnosis not present

## 2019-10-16 DIAGNOSIS — J449 Chronic obstructive pulmonary disease, unspecified: Secondary | ICD-10-CM

## 2019-10-16 DIAGNOSIS — R0602 Shortness of breath: Secondary | ICD-10-CM | POA: Diagnosis not present

## 2019-10-16 DIAGNOSIS — R0609 Other forms of dyspnea: Secondary | ICD-10-CM | POA: Insufficient documentation

## 2019-10-16 DIAGNOSIS — I272 Pulmonary hypertension, unspecified: Secondary | ICD-10-CM | POA: Diagnosis not present

## 2019-10-16 DIAGNOSIS — G4710 Hypersomnia, unspecified: Secondary | ICD-10-CM | POA: Diagnosis not present

## 2019-10-16 MED ORDER — BREZTRI AEROSPHERE 160-9-4.8 MCG/ACT IN AERO: 2.0000 | INHALATION_SPRAY | Freq: Two times a day (BID) | RESPIRATORY_TRACT | 11 refills | Status: DC

## 2019-10-16 MED ORDER — ALBUTEROL SULFATE HFA 108 (90 BASE) MCG/ACT IN AERS: 2.0000 | INHALATION_SPRAY | Freq: Four times a day (QID) | RESPIRATORY_TRACT | 6 refills | Status: AC | PRN

## 2019-10-16 NOTE — Progress Notes (Signed)
Pulmonary Individual Treatment Plan  Patient Details  Name: Ann Richardson MRN: [id number] Date of Birth: Aug 02, 1960 Referring Provider:     Pulmonary Rehab Walk Test from 10/02/2019 in [facility]  Referring Provider  Dr. 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  albuterol (PROAIR HFA) 108 (90 Base) MCG/ACT inhaler, Inhale 2 puffs into the lungs every 6 (six) hours as needed for wheezing or shortness of breath., Disp: 6.7 g, Rfl: 6 .  albuterol (PROVENTIL HFA;VENTOLIN HFA) 108 (90 BASE) MCG/ACT inhaler, Inhale 2 puffs into the lungs every 6 (six) hours as needed for wheezing or shortness of breath., Disp: , Rfl:  .  ALPRAZolam (XANAX) 1 MG tablet, Take 1 mg by mouth at bedtime as needed. , Disp: , Rfl:  .  ascorbic acid (VITAMIN C) 1000 MG tablet, Vitamin C 1,000 mg tablet  Take 3 tablets every day by oral route., Disp: , Rfl:  .  atorvastatin (LIPITOR) 10 MG tablet, Take 5 mg by mouth at bedtime. , Disp: , Rfl:  .  Budeson-Glycopyrrol-Formoterol (BREZTRI AEROSPHERE) 160-9-4.8 MCG/ACT AERO, Inhale 2 puffs into the lungs 2 (two) times daily., Disp: 10.7 g, Rfl: 11 .  famotidine (PEPCID) 20 MG tablet, One after supper, Disp: 30 tablet, Rfl: 11 .  morphine (MSIR) 15 MG tablet, Take by mouth., Disp: , Rfl:  .  ondansetron (ZOFRAN) 4 mg TABS tablet, , Disp: , Rfl:  .  pantoprazole (PROTONIX) 40 MG tablet, Take 1 tablet (40 mg total) by mouth daily. Take 30-60 min before first meal of the day, Disp: 30 tablet, Rfl: 2 .  Vitamin D, Ergocalciferol, (DRISDOL) 1.25 MG (50000 UNIT) CAPS capsule, Take 50,000 Units by mouth once a week., Disp: , Rfl:  .  Zoster Vaccine Adjuvanted (SHINGRIX) injection,  Shingrix (PF) 50 mcg/0.5 mL intramuscular suspension, kit  ADMINISTER 0.5ML IN THE MUSCLE AS DIRECTED, Disp: , Rfl:   Past Medical History: Past Medical History:  Diagnosis Date  . Arthritis    spine- degenerative   . COPD (chronic obstructive pulmonary disease) ([place])    CXR- 05/2016  . Hyperlipidemia   . 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 Pre-diabetes    pt. reports last A1C - 5/5, had been higher at one time     Tobacco Use: Social History   Tobacco Use  Smoking Status Former Smoker  . Packs/day: 1.00  . Years: 35.00  . Pack years: 35.00  . Quit date: 04/13/2019  . Years since quitting: 0.5  Smokeless Tobacco Never Used    Labs: Recent Review Scientist, physiological    Labs for ITP Cardiac and Pulmonary Rehab Latest Ref Rng & Units 06/09/2016   Hemoglobin A1c 4.8 - 5.6 % 5.4      Capillary Blood Glucose: No results found for: GLUCAP   Pulmonary Assessment Scores: Pulmonary Assessment Scores    Row Name 10/02/19 0954 10/02/19 1042       ADL UCSD   ADL Phase  Entry  Entry    SOB Score total  45  --      CAT Score   CAT Score  15  --  mMRC Score   mMRC Score  --  3      UCSD: Self-administered rating of dyspnea associated with activities of daily living (ADLs) 6-point scale (0 = "not at all" to 5 = "maximal or unable to do because of breathlessness")  Scoring Scores range from 0 to 120.  Minimally important difference is 5 units  CAT: CAT can identify the health impairment of COPD patients and is better correlated with disease progression.  CAT has a scoring range of zero to 40. The CAT score is classified into four groups of low (less than 10), medium (10 - 20), high (21-30) and very high (31-40) based on the impact level of disease on health status. A CAT score over 10 suggests significant symptoms.  A worsening CAT score could be explained by an exacerbation, poor medication adherence, poor inhaler technique, or progression of  COPD or comorbid conditions.  CAT MCID is 2 points  mMRC: mMRC (Modified Medical Research Council) Dyspnea Scale is used to assess the degree of baseline functional disability in patients of respiratory disease due to dyspnea. No minimal important difference is established. A decrease in score of 1 point or greater is considered a positive change.   Pulmonary Function Assessment:   Exercise Target Goals: Exercise Program Goal: Individual exercise prescription set using results from initial 6 min walk test and THRR while considering  patient's activity barriers and safety.   Exercise Prescription Goal: Initial exercise prescription builds to 30-45 minutes a day of aerobic activity, 2-3 days per week.  Home exercise guidelines will be given to patient during program as part of exercise prescription that the participant will acknowledge.  Activity Barriers & Risk Stratification: Activity Barriers & Cardiac Risk Stratification - 10/02/19 0950      Activity Barriers & Cardiac Risk Stratification   Activity Barriers  Arthritis;Back Problems;Muscular Weakness;Deconditioning;Shortness of Breath       6 Minute Walk: 6 Minute Walk    Row Name 10/02/19 1043         6 Minute Walk   Phase  Initial     Distance  1010 feet     Walk Time  6 minutes     # of Rest Breaks  0     MPH  1.91     METS  2.89     RPE  12     Perceived Dyspnea   3     VO2 Peak  10.13     Symptoms  No     Resting HR  83 bpm     Resting BP  110/66     Resting Oxygen Saturation   93 %     Exercise Oxygen Saturation  during 6 min walk  88 %     Max Ex. HR  111 bpm     Max Ex. BP  134/62     2 Minute Post BP  110/64       Interval HR   1 Minute HR  100     2 Minute HR  103     3 Minute HR  108     4 Minute HR  108     5 Minute HR  105     6 Minute HR  111     2 Minute Post HR  103     Interval Heart Rate?  Yes       Interval Oxygen   Interval Oxygen?  Yes     Baseline Oxygen Saturation %  93 %     1  Minute Oxygen Saturation %  91 %     1 Minute Liters of Oxygen  0 L     2 Minute Oxygen Saturation %  88 %     2 Minute Liters of Oxygen  0 L     3 Minute Oxygen Saturation %  90 %     3 Minute Liters of Oxygen  0 L     4 Minute Oxygen Saturation %  88 %     4 Minute Liters of Oxygen  0 L     5 Minute Oxygen Saturation %  90 %     5 Minute Liters of Oxygen  0 L     6 Minute Oxygen Saturation %  88 %     6 Minute Liters of Oxygen  0 L     2 Minute Post Oxygen Saturation %  94 %     2 Minute Post Liters of Oxygen  0 L        Oxygen Initial Assessment: Oxygen Initial Assessment - 10/02/19 1042      Home Oxygen   Home Oxygen Device  None    Sleep Oxygen Prescription  None    Home Exercise Oxygen Prescription  None    Home at Rest Exercise Oxygen Prescription  None    Compliance with Home Oxygen Use  Yes      Initial 6 min Walk   Oxygen Used  None      Program Oxygen Prescription   Program Oxygen Prescription  None      Intervention   Short Term Goals  To learn and exhibit compliance with exercise, home and travel O2 prescription;To learn and understand importance of monitoring SPO2 with pulse oximeter and demonstrate accurate use of the pulse oximeter.;To learn and understand importance of maintaining oxygen saturations>88%;To learn and demonstrate proper pursed lip breathing techniques or other breathing techniques.;To learn and demonstrate proper use of respiratory medications    Long  Term Goals  Exhibits compliance with exercise, home and travel O2 prescription;Verbalizes importance of monitoring SPO2 with pulse oximeter and return demonstration;Maintenance of O2 saturations>88%;Exhibits proper breathing techniques, such as pursed lip breathing or other method taught during program session;Compliance with respiratory medication;Demonstrates proper use of MDI's       Oxygen Re-Evaluation: Oxygen Re-Evaluation    Row Name 10/15/19 0759             Program Oxygen  Prescription   Program Oxygen Prescription  None         Home Oxygen   Home Oxygen Device  None       Sleep Oxygen Prescription  None       Home Exercise Oxygen Prescription  None       Home at Rest Exercise Oxygen Prescription  None       Compliance with Home Oxygen Use  Yes         Goals/Expected Outcomes   Short Term Goals  To learn and exhibit compliance with exercise, home and travel O2 prescription;To learn and understand importance of monitoring SPO2 with pulse oximeter and demonstrate accurate use of the pulse oximeter.;To learn and understand importance of maintaining oxygen saturations>88%;To learn and demonstrate proper pursed lip breathing techniques or other breathing techniques.;To learn and demonstrate proper use of respiratory medications       Long  Term Goals  Exhibits compliance with exercise, home and travel O2 prescription;Verbalizes importance of monitoring SPO2 with pulse  oximeter and return demonstration;Maintenance of O2 saturations>88%;Exhibits proper breathing techniques, such as pursed lip breathing or other method taught during program session;Compliance with respiratory medication;Demonstrates proper use of MDI's       Goals/Expected Outcomes  compliance          Oxygen Discharge (Final Oxygen Re-Evaluation): Oxygen Re-Evaluation - 10/15/19 0759      Program Oxygen Prescription   Program Oxygen Prescription  None      Home Oxygen   Home Oxygen Device  None    Sleep Oxygen Prescription  None    Home Exercise Oxygen Prescription  None    Home at Rest Exercise Oxygen Prescription  None    Compliance with Home Oxygen Use  Yes      Goals/Expected Outcomes   Short Term Goals  To learn and exhibit compliance with exercise, home and travel O2 prescription;To learn and understand importance of monitoring SPO2 with pulse oximeter and demonstrate accurate use of the pulse oximeter.;To learn and understand importance of maintaining oxygen saturations>88%;To learn  and demonstrate proper pursed lip breathing techniques or other breathing techniques.;To learn and demonstrate proper use of respiratory medications    Long  Term Goals  Exhibits compliance with exercise, home and travel O2 prescription;Verbalizes importance of monitoring SPO2 with pulse oximeter and return demonstration;Maintenance of O2 saturations>88%;Exhibits proper breathing techniques, such as pursed lip breathing or other method taught during program session;Compliance with respiratory medication;Demonstrates proper use of MDI's    Goals/Expected Outcomes  compliance       Initial Exercise Prescription: Initial Exercise Prescription - 10/02/19 1000      Date of Initial Exercise RX and Referring Provider   Date  10/02/19    Referring Provider  Dr. Melvyn Novas      Treadmill   MPH  1.8    Grade  1    Minutes  15      NuStep   Level  3    SPM  80    Minutes  15      Prescription Details   Frequency (times per week)  2    Duration  Progress to 30 minutes of continuous aerobic without signs/symptoms of physical distress      Intensity   THRR 40-80% of Max Heartrate  64-128    Ratings of Perceived Exertion  11-13    Perceived Dyspnea  0-4      Progression   Progression  Continue progressive overload as per policy without signs/symptoms or physical distress.      Resistance Training   Training Prescription  Yes    Weight  orange bands    Reps  10-15       Perform Capillary Blood Glucose checks as needed.  Exercise Prescription Changes: Exercise Prescription Changes    Row Name 10/15/19 1200             Response to Exercise   Blood Pressure (Admit)  140/80       Blood Pressure (Exercise)  158/80       Blood Pressure (Exit)  128/82       Heart Rate (Admit)  100 bpm       Heart Rate (Exercise)  121 bpm       Heart Rate (Exit)  99 bpm       Oxygen Saturation (Admit)  93 %       Oxygen Saturation (Exercise)  89 %       Oxygen Saturation (Exit)  94 %  Rating of  Perceived Exertion (Exercise)  12       Perceived Dyspnea (Exercise)  2.5       Duration  Continue with 30 min of aerobic exercise without signs/symptoms of physical distress.       Intensity  -- 40-80% HRR         Progression   Progression  Continue to progress workloads to maintain intensity without signs/symptoms of physical distress.         Resistance Training   Training Prescription  Yes       Weight  orange bands       Reps  10-15       Time  10 Minutes         Interval Training   Interval Training  No         Treadmill   MPH  1.8       Grade  0       Minutes  15         NuStep   Level  3       SPM  80       Minutes  15       METs  1.8          Exercise Comments:   Exercise Goals and Review: Exercise Goals    Row Name 10/02/19 1142 10/15/19 0759           Exercise Goals   Increase Physical Activity  Yes  Yes      Intervention  Provide advice, education, support and counseling about physical activity/exercise needs.;Develop an individualized exercise prescription for aerobic and resistive training based on initial evaluation findings, risk stratification, comorbidities and participant's personal goals.  Provide advice, education, support and counseling about physical activity/exercise needs.;Develop an individualized exercise prescription for aerobic and resistive training based on initial evaluation findings, risk stratification, comorbidities and participant's personal goals.      Expected Outcomes  Short Term: Attend rehab on a regular basis to increase amount of physical activity.;Long Term: Add in home exercise to make exercise part of routine and to increase amount of physical activity.;Long Term: Exercising regularly at least 3-5 days a week.  Short Term: Attend rehab on a regular basis to increase amount of physical activity.;Long Term: Add in home exercise to make exercise part of routine and to increase amount of physical activity.;Long Term: Exercising  regularly at least 3-5 days a week.      Increase Strength and Stamina  Yes  Yes      Intervention  Provide advice, education, support and counseling about physical activity/exercise needs.;Develop an individualized exercise prescription for aerobic and resistive training based on initial evaluation findings, risk stratification, comorbidities and participant's personal goals.  Provide advice, education, support and counseling about physical activity/exercise needs.;Develop an individualized exercise prescription for aerobic and resistive training based on initial evaluation findings, risk stratification, comorbidities and participant's personal goals.      Expected Outcomes  Short Term: Increase workloads from initial exercise prescription for resistance, speed, and METs.;Short Term: Perform resistance training exercises routinely during rehab and add in resistance training at home;Long Term: Improve cardiorespiratory fitness, muscular endurance and strength as measured by increased METs and functional capacity (6MWT)  Short Term: Increase workloads from initial exercise prescription for resistance, speed, and METs.;Short Term: Perform resistance training exercises routinely during rehab and add in resistance training at home;Long Term: Improve cardiorespiratory fitness, muscular endurance and strength as measured by increased METs and functional  capacity (6MWT)      Able to understand and use rate of perceived exertion (RPE) scale  Yes  Yes      Intervention  Provide education and explanation on how to use RPE scale  Provide education and explanation on how to use RPE scale      Expected Outcomes  Short Term: Able to use RPE daily in rehab to express subjective intensity level;Long Term:  Able to use RPE to guide intensity level when exercising independently  Short Term: Able to use RPE daily in rehab to express subjective intensity level;Long Term:  Able to use RPE to guide intensity level when exercising  independently      Able to understand and use Dyspnea scale  Yes  Yes      Intervention  Provide education and explanation on how to use Dyspnea scale  Provide education and explanation on how to use Dyspnea scale      Expected Outcomes  Short Term: Able to use Dyspnea scale daily in rehab to express subjective sense of shortness of breath during exertion;Long Term: Able to use Dyspnea scale to guide intensity level when exercising independently  Short Term: Able to use Dyspnea scale daily in rehab to express subjective sense of shortness of breath during exertion;Long Term: Able to use Dyspnea scale to guide intensity level when exercising independently      Knowledge and understanding of Target Heart Rate Range (THRR)  Yes  Yes      Intervention  Provide education and explanation of THRR including how the numbers were predicted and where they are located for reference  Provide education and explanation of THRR including how the numbers were predicted and where they are located for reference      Expected Outcomes  Short Term: Able to state/look up THRR;Short Term: Able to use daily as guideline for intensity in rehab;Long Term: Able to use THRR to govern intensity when exercising independently  Short Term: Able to state/look up THRR;Short Term: Able to use daily as guideline for intensity in rehab;Long Term: Able to use THRR to govern intensity when exercising independently      Understanding of Exercise Prescription  Yes  Yes      Intervention  Provide education, explanation, and written materials on patient's individual exercise prescription  Provide education, explanation, and written materials on patient's individual exercise prescription      Expected Outcomes  Short Term: Able to explain program exercise prescription;Long Term: Able to explain home exercise prescription to exercise independently  Short Term: Able to explain program exercise prescription;Long Term: Able to explain home exercise  prescription to exercise independently         Exercise Goals Re-Evaluation : Exercise Goals Re-Evaluation    Row Name 10/15/19 0800             Exercise Goal Re-Evaluation   Exercise Goals Review  Increase Physical Activity;Increase Strength and Stamina;Able to understand and use rate of perceived exertion (RPE) scale;Able to understand and use Dyspnea scale;Knowledge and understanding of Target Heart Rate Range (THRR);Understanding of Exercise Prescription       Comments  Pt has completed 2 exercise sessions. So far she is able to exercise on RA. It will be interesting to see if she can exercise on RA as we increase her intensity on the treadmill. She currently exercises at 1.8 METs on the stepper. Will continue to monitor and progress as able.       Expected Outcomes  Through exercise  at rehab and at home, the patient will decrease shortness of breath with daily activities and feel confident in carrying out an exercise regime at home.          Discharge Exercise Prescription (Final Exercise Prescription Changes): Exercise Prescription Changes - 10/15/19 1200      Response to Exercise   Blood Pressure (Admit)  140/80    Blood Pressure (Exercise)  158/80    Blood Pressure (Exit)  128/82    Heart Rate (Admit)  100 bpm    Heart Rate (Exercise)  121 bpm    Heart Rate (Exit)  99 bpm    Oxygen Saturation (Admit)  93 %    Oxygen Saturation (Exercise)  89 %    Oxygen Saturation (Exit)  94 %    Rating of Perceived Exertion (Exercise)  12    Perceived Dyspnea (Exercise)  2.5    Duration  Continue with 30 min of aerobic exercise without signs/symptoms of physical distress.    Intensity  --   40-80% HRR     Progression   Progression  Continue to progress workloads to maintain intensity without signs/symptoms of physical distress.      Resistance Training   Training Prescription  Yes    Weight  orange bands    Reps  10-15    Time  10 Minutes      Interval Training   Interval  Training  No      Treadmill   MPH  1.8    Grade  0    Minutes  15      NuStep   Level  3    SPM  80    Minutes  15    METs  1.8       Nutrition:  Target Goals: Understanding of nutrition guidelines, daily intake of sodium <1558m, cholesterol <2065m calories 30% from fat and 7% or less from saturated fats, daily to have 5 or more servings of fruits and vegetables.  Biometrics: Pre Biometrics - 10/02/19 0951      Pre Biometrics   Height  _0  (1.549 m)    Weight  152 lb 1.9 oz (69 kg)    BMI (Calculated)  28.76    Grip Strength  20 kg        Nutrition Therapy Plan and Nutrition Goals: Nutrition Therapy & Goals - 10/10/19 1400      Nutrition Therapy   Diet  General healthful/carb modified      Personal Nutrition Goals   Nutrition Goal  Pt to identify food quantities necessary to achieve weight loss of 6-24 lb at graduation from cardiac rehab.    Personal Goal #2  Pt to build a healthy plate including vegetables, fruits, whole grains, and low-fat dairy products in a heart healthy meal plan.      Intervention Plan   Intervention  Nutrition handout(s) given to patient.;Prescribe, educate and counsel regarding individualized specific dietary modifications aiming towards targeted core components such as weight, hypertension, lipid management, diabetes, heart failure and other comorbidities.    Expected Outcomes  Short Term Goal: A plan has been developed with personal nutrition goals set during dietitian appointment.;Long Term Goal: Adherence to prescribed nutrition plan.       Nutrition Assessments: Nutrition Assessments - 10/10/19 1401      Rate Your Plate Scores   Pre Score  48       Nutrition Goals Re-Evaluation: Nutrition Goals Re-Evaluation    Row Name 10/10/19 1401  Goals   Current Weight  152 lb (68.9 kg)       Nutrition Goal  Pt to identify food quantities necessary to achieve weight loss of 6-24 lb at graduation from cardiac rehab.          Personal Goal #2 Re-Evaluation   Personal Goal #2  Pt to build a healthy plate including vegetables, fruits, whole grains, and low-fat dairy products in a heart healthy meal plan.          Nutrition Goals Discharge (Final Nutrition Goals Re-Evaluation): Nutrition Goals Re-Evaluation - 10/10/19 1401      Goals   Current Weight  152 lb (68.9 kg)    Nutrition Goal  Pt to identify food quantities necessary to achieve weight loss of 6-24 lb at graduation from cardiac rehab.      Personal Goal #2 Re-Evaluation   Personal Goal #2  Pt to build a healthy plate including vegetables, fruits, whole grains, and low-fat dairy products in a heart healthy meal plan.       Psychosocial: Target Goals: Acknowledge presence or absence of significant depression and/or stress, maximize coping skills, provide positive support system. Participant is able to verbalize types and ability to use techniques and skills needed for reducing stress and depression.  Initial Review & Psychosocial Screening: Initial Psych Review & Screening - 10/02/19 0955      Initial Review   Current issues with  None Identified      Family Dynamics   Good Support System?  Yes      Barriers   Psychosocial barriers to participate in program  There are no identifiable barriers or psychosocial needs.      Screening Interventions   Interventions  Encouraged to exercise       Quality of Life Scores:  Scores of 19 and below usually indicate a poorer quality of life in these areas.  A difference of  2-3 points is a clinically meaningful difference.  A difference of 2-3 points in the total score of the Quality of Life Index has been associated with significant improvement in overall quality of life, self-image, physical symptoms, and general health in studies assessing change in quality of life.  PHQ-9: Recent Review Flowsheet Data    Depression screen [facility] 2/9 10/02/2019   Decreased Interest 0   Down, Depressed, Hopeless 0    PHQ - 2 Score 0   Altered sleeping 0   Tired, decreased energy 0   Change in appetite 0   Feeling bad or failure about yourself  0   Trouble concentrating 0   Moving slowly or fidgety/restless 0   Suicidal thoughts 0   Difficult doing work/chores Not difficult at all     Interpretation of Total Score  Total Score Depression Severity:  1-4 = Minimal depression, 5-9 = Mild depression, 10-14 = Moderate depression, 15-19 = Moderately severe depression, 20-27 = Severe depression   Psychosocial Evaluation and Intervention: Psychosocial Evaluation - 10/02/19 1002      Psychosocial Evaluation & Interventions   Interventions  Encouraged to exercise with the program and follow exercise prescription    Comments  No psychosocial concerns identified.    Continue Psychosocial Services   No Follow up required       Psychosocial Re-Evaluation: Psychosocial Re-Evaluation    [place] Name 10/15/19 [phone]             Psychosocial Re-Evaluation   Current issues with  None Identified       Comments  No concerns or barriers identified at this time.       Expected Outcomes  For patient to be free of barriers or psychosocial concerns while participating in pulmonary rehab.       Interventions  Encouraged to attend Pulmonary Rehabilitation for the exercise       Continue Psychosocial Services   No Follow up required          Psychosocial Discharge (Final Psychosocial Re-Evaluation): Psychosocial Re-Evaluation - 10/15/19 1497      Psychosocial Re-Evaluation   Current issues with  None Identified    Comments  No concerns or barriers identified at this time.    Expected Outcomes  For patient to be free of barriers or psychosocial concerns while participating in pulmonary rehab.    Interventions  Encouraged to attend Pulmonary Rehabilitation for the exercise    Continue Psychosocial Services   No Follow up required       Education: Education Goals: Education classes will be provided on a weekly  basis, covering required topics. Participant will state understanding/return demonstration of topics presented.  Learning Barriers/Preferences: Learning Barriers/Preferences - 10/02/19 1004      Learning Barriers/Preferences   Learning Barriers  None    Learning Preferences  Computer/Internet;Written Material       Education Topics: Risk Factor Reduction:  -Group instruction that is supported by a PowerPoint presentation. Instructor discusses the definition of a risk factor, different risk factors for pulmonary disease, and how the heart and lungs work together.     Nutrition for Pulmonary Patient:  -Group instruction provided by PowerPoint slides, verbal discussion, and written materials to support subject matter. The instructor gives an explanation and review of healthy diet recommendations, which includes a discussion on weight management, recommendations for fruit and vegetable consumption, as well as protein, fluid, caffeine, fiber, sodium, sugar, and alcohol. Tips for eating when patients are short of breath are discussed.   Pursed Lip Breathing:  -Group instruction that is supported by demonstration and informational handouts. Instructor discusses the benefits of pursed lip and diaphragmatic breathing and detailed demonstration on how to preform both.     Oxygen Safety:  -Group instruction provided by PowerPoint, verbal discussion, and written material to support subject matter. There is an overview of "What is Oxygen" and "Why do we need it".  Instructor also reviews how to create a safe environment for oxygen use, the importance of using oxygen as prescribed, and the risks of noncompliance. There is a brief discussion on traveling with oxygen and resources the patient may utilize.   Oxygen Equipment:  -Group instruction provided by [facility] Staff utilizing handouts, written materials, and equipment demonstrations.   Signs and Symptoms:  -Group instruction provided by  written material and verbal discussion to support subject matter. Warning signs and symptoms of infection, stroke, and heart attack are reviewed and when to call the physician/911 reinforced. Tips for preventing the spread of infection discussed.   Advanced Directives:  -Group instruction provided by verbal instruction and written material to support subject matter. Instructor reviews Advanced Directive laws and proper instruction for filling out document.   Pulmonary Video:  -Group video education that reviews the importance of medication and oxygen compliance, exercise, good nutrition, pulmonary hygiene, and pursed lip and diaphragmatic breathing for the pulmonary patient.   Exercise for the Pulmonary Patient:  -Group instruction that is supported by a PowerPoint presentation. Instructor discusses benefits of exercise, core components of exercise, frequency, duration, and intensity of an exercise routine, importance  of utilizing pulse oximetry during exercise, safety while exercising, and options of places to exercise outside of rehab.     Pulmonary Medications:  -Verbally interactive group education provided by instructor with focus on inhaled medications and proper administration.   Anatomy and Physiology of the Respiratory System and Intimacy:  -Group instruction provided by PowerPoint, verbal discussion, and written material to support subject matter. Instructor reviews respiratory cycle and anatomical components of the respiratory system and their functions. Instructor also reviews differences in obstructive and restrictive respiratory diseases with examples of each. Intimacy, Sex, and Sexuality differences are reviewed with a discussion on how relationships can change when diagnosed with pulmonary disease. Common sexual concerns are reviewed.   MD DAY -A group question and answer session with a medical doctor that allows participants to ask questions that relate to their pulmonary  disease state.   OTHER EDUCATION -Group or individual verbal, written, or video instructions that support the educational goals of the pulmonary rehab program.   PULMONARY REHAB CHRONIC OBSTRUCTIVE PULMONARY DISEASE from 10/10/2019 in [place]  Date  10/10/19  Educator  [facility] handout]      Holiday Eating Survival Tips:  -Group instruction provided by PowerPoint slides, verbal discussion, and written materials to support subject matter. The instructor gives patients tips, tricks, and techniques to help them not only survive but enjoy the holidays despite the onslaught of food that accompanies the holidays.   Knowledge Questionnaire Score: Knowledge Questionnaire Score - 10/02/19 1004      Knowledge Questionnaire Score   Pre Score  16/18       Core Components/Risk Factors/Patient Goals at Admission: Personal Goals and Risk Factors at Admission - 10/02/19 1006      Core Components/Risk Factors/Patient Goals on Admission   Improve shortness of breath with ADL's  Yes    Intervention  Provide education, individualized exercise plan and daily activity instruction to help decrease symptoms of SOB with activities of daily living.    Expected Outcomes  Short Term: Improve cardiorespiratory fitness to achieve a reduction of symptoms when performing ADLs;Long Term: Be able to perform more ADLs without symptoms or delay the onset of symptoms       Core Components/Risk Factors/Patient Goals Review:  Goals and Risk Factor Review    Row Name 10/15/19 0856             Core Components/Risk Factors/Patient Goals Review   Personal Goals Review  Develop more efficient breathing techniques such as purse lipped breathing and diaphragmatic breathing and practicing self-pacing with activity.;Increase knowledge of respiratory medications and ability to use respiratory devices properly.;Improve shortness of breath with ADL's       Review  Ann Richardson has attended  1 exercise session, too early to see progression towards goals.       Expected Outcomes  See admission goals.          Core Components/Risk Factors/Patient Goals at Discharge (Final Review):  Goals and Risk Factor Review - 10/15/19 0856      Core Components/Risk Factors/Patient Goals Review   Personal Goals Review  Develop more efficient breathing techniques such as purse lipped breathing and diaphragmatic breathing and practicing self-pacing with activity.;Increase knowledge of respiratory medications and ability to use respiratory devices properly.;Improve shortness of breath with ADL's    Review  Ann Richardson has attended 1 exercise session, too early to see progression towards goals.    Expected Outcomes  See admission goals.  ITP Comments:   Comments: ITP REVIEW Pt is making expected progress toward pulmonary rehab goals after completing 3 sessions. Recommend continued exercise, life style modification, education, and utilization of breathing techniques to increase stamina and strength and decrease shortness of breath with exertion.

## 2019-10-16 NOTE — Assessment & Plan Note (Signed)
Reviewed 2019 pulmonary function test today with patient Patient feels her breathing is a little bit better on Breztri inhaler Potential for having pulmonary hypertension based off of last chest x-ray which was clear COPD stage III based off of May/2019 pulmonary function test Quit smoking in October/2020 Continues to have ongoing dyspnea, recent weight increase over the last 2 to 3 months  Plan: Continue pulmonary rehab Continue Breztri  8-week follow-up in our office with Dr. Sherene Sires

## 2019-10-16 NOTE — Assessment & Plan Note (Signed)
Pending home sleep study Severe COPD on 2019 pulmonary function testing Mildly elevated pulmonary pressures on 2021 echocardiogram  Plan: Continue to clinically monitor Optimize pulmonary management We will await home sleep study results Referral to cardiology for further evaluation of dyspnea on exertion

## 2019-10-16 NOTE — Patient Instructions (Addendum)
You were seen today by Ann Ceo, NP  for:   1. COPD GOLD III  Breztri >>> 2 puffs in the morning right when you wake up, rinse out your mouth after use, 12 hours later 2 puffs, rinse after use >>> Take this daily, no matter what >>> This is not a rescue inhaler   We have sent in a prescription for ProAir at your request  Continue forward with pulmonary rehab referral  2. Shortness of breath  - Ambulatory referral to Cardiology  We have referred you to cardiology for evaluation of your shortness of breath/dyspnea on exertion  3.  Suspected OSA   If you have not heard from our office regarding your home sleep study results by 10/28/2019 please give [place] a call.  Dr. Maple Hudson has received the report but has not been formally read yet.  We recommend today:  Orders Placed This Encounter  Procedures  . Ambulatory referral to Cardiology    Referral Priority:   Routine    Referral Type:   Consultation    Referral Reason:   Specialty Services Required    Requested Specialty:   Cardiology    Number of Visits Requested:   1   Orders Placed This Encounter  Procedures  . Ambulatory referral to Cardiology   No orders of the defined types were placed in this encounter.   Follow Up:    Return in about 2 months (around 12/16/2019), or if symptoms worsen or fail to improve, for Follow up with Dr. Sherene Sires.   Please do your part to reduce the spread of COVID-19:      Reduce your risk of any infection  and COVID19 by using the similar precautions used for avoiding the common cold or flu:  Marland Kitchen Wash your hands often with soap and warm water for at least 20 seconds.  If soap and water are not readily available, use an alcohol-based hand sanitizer with at least 60% alcohol.  . If coughing or sneezing, cover your mouth and nose by coughing or sneezing into the elbow areas of your shirt or coat, into a tissue or into your sleeve (not your hands). Drinda Butts A MASK when in public  . Avoid shaking hands  with others and consider head nods or verbal greetings only. . Avoid touching your eyes, nose, or mouth with unwashed hands.  . Avoid close contact with people who are sick. . Avoid places or events with large numbers of people in one location, like concerts or sporting events. . If you have some symptoms but not all symptoms, continue to monitor at home and seek medical attention if your symptoms worsen. . If you are having a medical emergency, call 911.   ADDITIONAL HEALTHCARE OPTIONS FOR PATIENTS  [place] [facility] / e-Visit: [email or web address]         [facility]: [phone]  [facility]: [phone]                   [facility]: [phone]     It is flu season:   >>> Best ways to protect herself from the flu: Receive the yearly flu vaccine, practice good hand hygiene washing with soap and also using hand sanitizer when available, eat a nutritious meals, get adequate rest, hydrate appropriately   Please contact the office if your symptoms worsen or you have concerns that you are not improving.   Thank you for choosing [facility] for your healthcare,  and for allowing [place] to partner with you on your healthcare journey. I am thankful to be able to provide care to you today.   Wyn Quaker FNP-C

## 2019-10-16 NOTE — Progress Notes (Signed)
Cardiology Office Note:    Date:  10/17/2019   ID:  ANWITHA MAPES, DOB 1960/11/24, MRN [id number]  PCP:  Heywood Bene, PA-C  Cardiologist:  Fatim Vanderschaaf  Electrophysiologist:  None   Referring MD: Lauraine Rinne, NP   Chief Complaint  Patient presents with  . Shortness of Breath    History of Present Illness:    October 17, 2019:  Ann Richardson is a 59 y.o. female with a hx of smoking, hyperlipidemia ,  COPD and increasing dyspnea.   We were asked to see her for further evaluation of her dyspnea by Clyde Lundborg, PA   Echocardiogram performed October 04, 2019 reveals normal left ventricular systolic function.  There is mildly elevated pulmonary pressures  Has had DOE since January 2021.  Has seen pulmonary .   Saw Dr. Melvyn Novas - thought it might be GERD. Was given steroids, protonix, pepcid.  Her dyspnea has not improved.  Echo showed normal LV function.  She has mildly elevated pulmonary pressures Has done a sleep study .  Has known COPD , stage III.  Quit smoking in Oct. 2020.  Smoked for 40+ years    Pulmonary function test from May, 2019 revealed an FEV1 of 1.3. The FEF 25-75 is 22% predicted. The final interpretation was severe obstructive lung disease and severe reduction in diffusion capacity.  No cp .   No regular exercise   Labs drawn at [facility] at her primary medical doctor's office were reviewed.  Her triglyceride level is 164.  The total cholesterol is 211.  The HDL is 56.  The LDL is 122.  Has family of cardiac disease.     Past Medical History:  Diagnosis Date  . Arthritis    spine- degenerative   . COPD (chronic obstructive pulmonary disease) ([place])    CXR- 05/2016  . Hyperlipidemia   . Neuromuscular disorder ([place])    sciata- relative to back issue  . Pre-diabetes    pt. reports last A1C - 5/5, had been higher at one time     Past Surgical History:  Procedure Laterality Date  . [place]  .  DILATION AND CURETTAGE OF UTERUS     D&C  . LUMBAR LAMINECTOMY/DECOMPRESSION MICRODISCECTOMY N/A 06/15/2016   Procedure: L4-5, L5-S1 Decompression 2 LEVELS, InSitu FUSION L4-L5;  Surgeon: Melina Schools, MD;  Location: [place];  Service: Orthopedics;  Laterality: N/A;  . TONSILLECTOMY    . WISDOM TOOTH EXTRACTION     done in the hosp., done for impaction     Current Medications: Current Meds  Medication Sig  . albuterol (PROAIR HFA) 108 (90 Base) MCG/ACT inhaler Inhale 2 puffs into the lungs every 6 (six) hours as needed for wheezing or shortness of breath.  Marland Kitchen albuterol (PROVENTIL HFA;VENTOLIN HFA) 108 (90 BASE) MCG/ACT inhaler Inhale 2 puffs into the lungs every 6 (six) hours as needed for wheezing or shortness of breath.  . ALPRAZolam (XANAX) 1 MG tablet Take 1 mg by mouth at bedtime as needed.   Marland Kitchen ascorbic acid (VITAMIN C) 1000 MG tablet Vitamin C 1,000 mg tablet  Take 3 tablets every day by oral route.  . Budeson-Glycopyrrol-Formoterol (BREZTRI AEROSPHERE) 160-9-4.8 MCG/ACT AERO Inhale 2 puffs into the lungs 2 (two) times daily.  . famotidine (PEPCID) 20 MG tablet One after supper  . morphine (MSIR) 15 MG tablet Take by mouth.  . ondansetron (ZOFRAN) 4 mg TABS tablet   . pantoprazole (PROTONIX) 40  MG tablet Take 1 tablet (40 mg total) by mouth daily. Take 30-60 min before first meal of the day  . Vitamin D, Ergocalciferol, (DRISDOL) 1.25 MG (50000 UNIT) CAPS capsule Take 50,000 Units by mouth once a week.  . Zoster Vaccine Adjuvanted (SHINGRIX) injection Shingrix (PF) 50 mcg/0.5 mL intramuscular suspension, kit  ADMINISTER 0.5ML IN THE MUSCLE AS DIRECTED  . [DISCONTINUED] atorvastatin (LIPITOR) 10 MG tablet Take 5 mg by mouth at bedtime.      Allergies:   Other   Social History   Socioeconomic History  . Marital status: Single    Spouse name: Not on file  . Number of children: Not on file  . Years of education: Not on file  . Highest education level: Not on file  Occupational  History  . Occupation: Radio producer  Tobacco Use  . Smoking status: Former Smoker    Packs/day: 1.00    Years: 35.00    Pack years: 35.00    Quit date: 04/13/2019    Years since quitting: 0.5  . Smokeless tobacco: Never Used  Substance and Sexual Activity  . Alcohol use: Yes    Alcohol/week: 0.0 standard drinks    Comment: social- one per month   . Drug use: No  . Sexual activity: Not on file  Other Topics Concern  . Not on file  Social History Narrative  . Not on file   Social Determinants of Health   Financial Resource Strain:   . Difficulty of Paying Living Expenses:   Food Insecurity:   . Worried About Charity fundraiser in the Last Year:   . Arboriculturist in the Last Year:   Transportation Needs:   . Film/video editor (Medical):   Marland Kitchen Lack of Transportation (Non-Medical):   Physical Activity:   . Days of Exercise per Week:   . Minutes of Exercise per Session:   Stress:   . Feeling of Stress :   Social Connections:   . Frequency of Communication with Friends and Family:   . Frequency of Social Gatherings with Friends and Family:   . Attends Religious Services:   . Active Member of Clubs or Organizations:   . Attends Archivist Meetings:   Marland Kitchen Marital Status:      Family History: The patient's family history includes Breast cancer in her mother; Emphysema in her mother; Heart disease in her father and mother.  ROS:   Please see the history of present illness.     All other systems reviewed and are negative.  EKGs/Labs/Other Studies Reviewed:    The following studies were reviewed today:   EKG: October 17, 2019: Normal sinus rhythm at 87. No ST or T wave changes.  Recent Labs: 09/17/2019: ALT 20; BUN 19; Creatinine, Ser 0.72; Hemoglobin 13.3; Platelets 260.0; Potassium 4.0; Pro B Natriuretic peptide (BNP) 16.0; Sodium 138  Recent Lipid Panel No results found for: CHOL, TRIG, HDL, CHOLHDL, VLDL, LDLCALC, LDLDIRECT  Physical  Exam:     Physical Exam: Blood pressure 138/80, pulse 88, height _0  (1.549 m), weight 151 lb 8 oz (68.7 kg), SpO2 95 %.  GEN:  Middle age female,  NAD  HEENT: Normal NECK: No JVD; No carotid bruits LYMPHATICS: No lymphadenopathy CARDIAC: RRR , no murmurs, rubs, gallops RESPIRATORY:  Clear to auscultation without rales, wheezing or rhonchi  ABDOMEN: Soft, non-tender, non-distended MUSCULOSKELETAL:  No edema; No deformity  SKIN: Warm and dry NEUROLOGIC:  Alert and oriented x 3  ASSESSMENT:    1. DOE (dyspnea on exertion)   2. Pulmonary hypertension, unspecified ([place])   3. Hyperlipidemia, unspecified hyperlipidemia type    PLAN:    In order of problems listed above:  1. Shortness of breath: Patient has severe progressive shortness of breath.  Is especially worse with exertion.  She has severe COPD and severely reduced diffusion capacity by pulmonary function test and 2018.  Echocardiogram has shown normal left ventricular function with mildly elevated pulmonary pressures.  I suspect that all or perhaps most of her shortness of breath is due to her COPD.  The mildly elevated pulmonary pressures are almost certainly due to her severe COPD and destruction of lung tissue.  I cannot completely rule out coronary artery disease.  She does have a long history of smoking over 40 years.  She has a family history of cardiac disease and also has a history of hypertension hyperlipidemia.  I like to get a coronary CT angiogram for further evaluation.  Alternatively we could consider a [place] study if she does not call for the coronary CT angiogram.  2.  Hyperlipidemia: She is intolerant to atorvastatin high-dose.  She is on atorvastatin 5 mg but her LDL is still elevated at 122.  Given her long history of smoking and her family history of coronary artery disease, I think that she needs an LDL between 70 and 100.  We will start her on rosuvastatin 10 mg a day.  Will check lipids,  liver enzymes, basic metabolic profile in 3 months.    Medication Adjustments/Labs and Tests Ordered: Current medicines are reviewed at length with the patient today.  Concerns regarding medicines are outlined above.  Orders Placed This Encounter  Procedures  . CT CORONARY MORPH W/CTA COR W/SCORE W/CA W/CM &/OR WO/CM  . CT CORONARY FRACTIONAL FLOW RESERVE DATA PREP  . CT CORONARY FRACTIONAL FLOW RESERVE FLUID ANALYSIS  . Lipid Profile  . Basic Metabolic Panel (BMET)  . Hepatic function panel  . EKG 12-Lead   Meds ordered this encounter  Medications  . rosuvastatin (CRESTOR) 10 MG tablet    Sig: Take 1 tablet (10 mg total) by mouth daily.    Dispense:  90 tablet    Refill:  3    There are no Patient Instructions on file for this visit.   Signed, Mertie Moores, MD  10/17/2019 1:57 PM    [facility]

## 2019-10-16 NOTE — Assessment & Plan Note (Signed)
Suspected obstructive sleep apnea Completed home sleep study Waiting for results

## 2019-10-16 NOTE — Assessment & Plan Note (Signed)
Patient continues to have shortness of breath.  Shortness of breath is likely multifactorial.  Pulmonary work-up continues to show severe obstructive lung disease.  She is maintained on adequate inhalers.  Given her smoking history as well as familial risk for CAD, or close chronic disease will refer to cardiology at patient's request.  Plan: Referral to cardiology

## 2019-10-17 ENCOUNTER — Other Ambulatory Visit: Payer: Self-pay

## 2019-10-17 ENCOUNTER — Telehealth: Payer: Self-pay | Admitting: Internal Medicine

## 2019-10-17 ENCOUNTER — Encounter: Payer: Self-pay | Admitting: Cardiovascular Disease

## 2019-10-17 ENCOUNTER — Telehealth (HOSPITAL_COMMUNITY): Payer: Self-pay | Admitting: Physician Assistant

## 2019-10-17 ENCOUNTER — Ambulatory Visit (INDEPENDENT_AMBULATORY_CARE_PROVIDER_SITE_OTHER): Payer: 59 | Admitting: Cardiovascular Disease

## 2019-10-17 ENCOUNTER — Encounter (HOSPITAL_COMMUNITY)
Admission: RE | Admit: 2019-10-17 | Discharge: 2019-10-17 | Disposition: A | Discharge status: Home / Self Care | Payer: 59 | Source: Ambulatory Visit | Attending: Internal Medicine | Admitting: Internal Medicine

## 2019-10-17 VITALS — BP 138/80 | HR 88 | Ht 61.0 in | Wt 151.5 lb

## 2019-10-17 DIAGNOSIS — I272 Pulmonary hypertension, unspecified: Secondary | ICD-10-CM | POA: Diagnosis not present

## 2019-10-17 DIAGNOSIS — R0609 Other forms of dyspnea: Secondary | ICD-10-CM

## 2019-10-17 DIAGNOSIS — E785 Hyperlipidemia, unspecified: Secondary | ICD-10-CM | POA: Diagnosis not present

## 2019-10-17 DIAGNOSIS — R0600 Dyspnea, unspecified: Secondary | ICD-10-CM | POA: Diagnosis not present

## 2019-10-17 DIAGNOSIS — J449 Chronic obstructive pulmonary disease, unspecified: Secondary | ICD-10-CM | POA: Diagnosis not present

## 2019-10-17 DIAGNOSIS — G4734 Idiopathic sleep related nonobstructive alveolar hypoventilation: Secondary | ICD-10-CM

## 2019-10-17 MED ORDER — ROSUVASTATIN CALCIUM 10 MG PO TABS: 10.0000 mg | ORAL_TABLET | Freq: Every day | ORAL | 3 refills | Status: DC

## 2019-10-17 MED ORDER — METOPROLOL TARTRATE 100 MG PO TABS: ORAL_TABLET | ORAL | 0 refills | Status: DC

## 2019-10-17 NOTE — Patient Instructions (Addendum)
Medication Instructions:  Your physician has recommended you make the following change in your medication:  STOP Atorvastatin (Lipitor) START Rosuvastatin (Crestor) 10 mg once daily   *If you need a refill on your cardiac medications before your next appointment, please call your pharmacy*   Lab Work: Your physician recommends that you return for lab work: 1 week before your Coronary CT  Your physician recommends that you return for lab work in: 3 months on Friday July 16 prior to your office visit with Dr. Acie Fredrickson.  You will need to FAST for this appointment - nothing to eat or drink after midnight the night before except water.   If you have labs (blood work) drawn today and your tests are completely normal, you will receive your results only by: Marland Kitchen MyChart Message (if you have MyChart) OR . A paper copy in the mail If you have any lab test that is abnormal or we need to change your treatment, we will call you to review the results.    Follow-Up: At [facility], you and your health needs are our priority.  As part of our continuing mission to provide you with exceptional heart care, we have created designated Provider Care Teams.  These Care Teams include your primary Cardiologist (physician) and Advanced Practice Providers (APPs -  Physician Assistants and Nurse Practitioners) who all work together to provide you with the care you need, when you need it.   Your next appointment:   3 month(s)  The format for your next appointment:   In Person  Provider:   Mertie Moores, MD   Testing/Procedures: Your cardiac CT will be scheduled at one of the below locations:   [facility] [address] [place], [place] [address] [phone]  [place] [address] [place], [place] [address] [phone]  If scheduled at [facility], please arrive at the [facility] main entrance of [facility] 30  minutes prior to test start time. Proceed to the [facility] Radiology Department (first floor) to check-in and test prep.  If scheduled at [facility], please arrive 15 mins early for check-in and test prep.  Please follow these instructions carefully (unless otherwise directed):   On the Night Before the Test: . Be sure to Drink plenty of water. . Do not consume any caffeinated/decaffeinated beverages or chocolate 12 hours prior to your test. . Do not take any antihistamines 12 hours prior to your test. . If you take Metformin do not take 24 hours prior to test. . If the patient has contrast allergy: ? Patient will need a prescription for Prednisone and very clear instructions (as follows): 1. Prednisone 50 mg - take 13 hours prior to test 2. Take another Prednisone 50 mg 7 hours prior to test 3. Take another Prednisone 50 mg 1 hour prior to test 4. Take Benadryl 50 mg 1 hour prior to test . Patient must complete all four doses of above prophylactic medications. . Patient will need a ride after test due to Benadryl.  On the Day of the Test: . Drink plenty of water. Do not drink any water within one hour of the test. . Do not eat any food 4 hours prior to the test. . You may take your regular medications prior to the test.  . Take metoprolol (Lopressor) two hours prior to test. . FEMALES- please wear underwire-free bra if available  After the Test: . Drink plenty of water. . After receiving IV contrast, you may experience a mild flushed feeling. This is normal. . On occasion, you may experience a mild rash up to 24 hours after the test. This is not dangerous. If this occurs, you can take Benadryl 25 mg and increase your fluid intake. . If you experience trouble breathing, this can be serious. If it is severe call 911 IMMEDIATELY. If it is mild, please call our office. . If you take any of these medications: Glipizide/Metformin, Avandament,  Glucavance, please do not take 48 hours after completing test unless otherwise instructed.   Once we have confirmed authorization from your insurance company, we will call you to set up a date and time for your test.   For non-scheduling related questions, please contact the cardiac imaging nurse navigator should you have any questions/concerns: Rockwell Alexandria, RN Navigator Cardiac Imaging [facility] [phone] office  For scheduling needs, including cancellations and rescheduling, please call [phone].

## 2019-10-17 NOTE — Telephone Encounter (Signed)
Spoke with the pt and notified of results and ONO was ordered  She would like a copy of her sleep study mailed to her  Done

## 2019-10-17 NOTE — Telephone Encounter (Signed)
Per pt request will call after 3:00 pm

## 2019-10-17 NOTE — Telephone Encounter (Signed)
Home sleep test showed only a few apnea events, within normal limits, so she does not have sleep apnea. However- her blood oxygen levels were abnormally low. We need to recheck that, to see if she needs oxygen at night while she sleeps.  Please order Overnight Oximetry on room air    dx Nocturnal Hypoxemia

## 2019-10-17 NOTE — Telephone Encounter (Signed)
Dr. Maple Hudson, can you please advise on her HST results. They have been scanned in her chart. Thanks!

## 2019-10-22 ENCOUNTER — Other Ambulatory Visit: Payer: Self-pay

## 2019-10-22 ENCOUNTER — Encounter (HOSPITAL_COMMUNITY)
Admission: RE | Admit: 2019-10-22 | Discharge: 2019-10-22 | Disposition: A | Discharge status: Home / Self Care | Payer: 59 | Source: Ambulatory Visit | Attending: Internal Medicine | Admitting: Internal Medicine

## 2019-10-22 DIAGNOSIS — J449 Chronic obstructive pulmonary disease, unspecified: Secondary | ICD-10-CM | POA: Diagnosis not present

## 2019-10-22 NOTE — Progress Notes (Signed)
Daily Session Note  Patient Details  Name: Ann Richardson MRN: [id number] Date of Birth: 1961-04-19 Referring Provider:     Pulmonary Rehab Walk Test from 10/02/2019 in [place]  Referring Provider  Dr. Melvyn Novas      Encounter Date: 10/22/2019  Check In: Session Check In - 10/22/19 1200      Check-In   Supervising physician immediately available to respond to emergencies  [facility] immediately available    Physician(s)  Dr. Maylene Roes    Location  [facility]    Staff Present  Rosebud Poles, RN, Bjorn Loser, MS, Exercise Physiologist;Tanazia Achee Ysidro Evert, RN    Virtual Visit  No    Medication changes reported      No    Fall or balance concerns reported     No    Tobacco Cessation  No Change    Warm-up and Cool-down  Performed on first and last piece of equipment    Resistance Training Performed  Yes    VAD Patient?  No    PAD/SET Patient?  No      Pain Assessment   Currently in Pain?  No/denies    Multiple Pain Sites  No       Capillary Blood Glucose: No results found for this or any previous visit (from the past 24 hour(s)).    Social History   Tobacco Use  Smoking Status Former Smoker  . Packs/day: 1.00  . Years: 35.00  . Pack years: 35.00  . Quit date: 04/13/2019  . Years since quitting: 0.5  Smokeless Tobacco Never Used    Goals Met:  Exercise tolerated well No report of cardiac concerns or symptoms Strength training completed today  Goals Unmet:  Not Applicable  Comments: Service time is from 1040 to 1150    Dr. Fransico Him is Medical Director for Cardiac Rehab at [facility].

## 2019-10-22 NOTE — Progress Notes (Signed)
Nutrition Note  Spoke with pt to review food log x1 week. She eats 1-2 meals per day and sometimes a snack. Very few nutrient dense foods (low fruit, veggie, whole grains). Low intake of calories. Typical day includes belvita, bologna, pizza/yams/soemtimes salad or veggies. Pt should not reduce quantity of food at this point. Previous weight gain is likely from sudden onset of sedentary behaviors. Pt agrees. She is exercising in pulmonary rehab and trying to exercise at home. Recommended incorporating nutrient dense foods at least 1x per day. Will Continue to monitor pt for overall increased nutritional quality in her diet.  Andrey Campanile, MS, RDN, LDN

## 2019-10-24 ENCOUNTER — Encounter: Payer: Self-pay | Admitting: Internal Medicine

## 2019-10-24 ENCOUNTER — Encounter (HOSPITAL_COMMUNITY): Payer: 59

## 2019-10-29 ENCOUNTER — Encounter (HOSPITAL_COMMUNITY)
Admission: RE | Admit: 2019-10-29 | Discharge: 2019-10-29 | Disposition: A | Discharge status: Home / Self Care | Payer: 59 | Source: Ambulatory Visit | Attending: Internal Medicine | Admitting: Internal Medicine

## 2019-10-29 ENCOUNTER — Other Ambulatory Visit: Payer: Self-pay

## 2019-10-29 DIAGNOSIS — J449 Chronic obstructive pulmonary disease, unspecified: Secondary | ICD-10-CM | POA: Insufficient documentation

## 2019-10-29 NOTE — Progress Notes (Signed)
Daily Session Note  Patient Details  Name: Ann Richardson MRN: [id number] Date of Birth: 02/27/1961 Referring Provider:     Pulmonary Rehab Walk Test from 10/02/2019 in [facility]  Referring Provider  Dr. Wert      Encounter Date: 10/29/2019  Check In: Session Check In - 10/29/19 1107      Check-In   Supervising physician immediately available to respond to emergencies  [facility] immediately available    Physician(s)  Dr. C. Woods    Location  [facility]    Staff Present  Joan Behrens, RN, BSN;Dalton Fletcher, MS, Exercise Physiologist;Lisa Hughes, RN    Virtual Visit  No    Medication changes reported      No    Fall or balance concerns reported     No    Tobacco Cessation  No Change    Warm-up and Cool-down  Performed as group-led instruction    Resistance Training Performed  Yes    VAD Patient?  No    PAD/SET Patient?  No      Pain Assessment   Currently in Pain?  No/denies    Multiple Pain Sites  No       Capillary Blood Glucose: No results found for this or any previous visit (from the past 24 hour(s)).  Exercise Prescription Changes - 10/29/19 1200      Response to Exercise   Blood Pressure (Admit)  128/80    Blood Pressure (Exercise)  140/84    Blood Pressure (Exit)  132/84    Heart Rate (Admit)  92 bpm    Heart Rate (Exercise)  114 bpm    Heart Rate (Exit)  97 bpm    Oxygen Saturation (Admit)  97 %    Oxygen Saturation (Exercise)  90 %    Oxygen Saturation (Exit)  95 %    Rating of Perceived Exertion (Exercise)  12    Perceived Dyspnea (Exercise)  2.5    Duration  Continue with 30 min of aerobic exercise without signs/symptoms of physical distress.    Intensity  THRR unchanged      Progression   Progression  Continue to progress workloads to maintain intensity without signs/symptoms of physical distress.      Resistance Training   Training Prescription  Yes    Weight  orange bands    Reps   10-15    Time  10 Minutes      Interval Training   Interval Training  No      Treadmill   MPH  1.8    Grade  0    Minutes  15      NuStep   Level  3    SPM  80    Minutes  15    METs  2       Social History   Tobacco Use  Smoking Status Former Smoker  . Packs/day: 1.00  . Years: 35.00  . Pack years: 35.00  . Quit date: 04/13/2019  . Years since quitting: 0.5  Smokeless Tobacco Never Used    Goals Met:  Proper associated with RPD/PD & O2 Sat Exercise tolerated well Strength training completed today  Goals Unmet:  Not Applicable  Comments: Service time is from 1050 to 1150    Dr. Traci Turner is Medical Director for Cardiac Rehab at [facility]. 

## 2019-10-31 ENCOUNTER — Encounter (HOSPITAL_COMMUNITY)
Admission: RE | Admit: 2019-10-31 | Discharge: 2019-10-31 | Disposition: A | Discharge status: Home / Self Care | Payer: 59 | Source: Ambulatory Visit | Attending: Internal Medicine | Admitting: Internal Medicine

## 2019-10-31 ENCOUNTER — Other Ambulatory Visit: Payer: Self-pay

## 2019-10-31 DIAGNOSIS — J449 Chronic obstructive pulmonary disease, unspecified: Secondary | ICD-10-CM

## 2019-10-31 NOTE — Progress Notes (Signed)
I have reviewed a Home Exercise Prescription with Ann Richardson . Geet is not currently exercising at home.  The patient was advised to walk 2 days a week for 30-45 minutes.  Jacelyn and I discussed how to progress their exercise prescription.  The patient stated that their goals were to breathe better and lose weight.  The patient stated that they understand the exercise prescription.  We reviewed exercise guidelines, target heart rate during exercise, RPE Scale, weather conditions, NTG use, endpoints for exercise, warmup and cool down.  Patient is encouraged to come to me with any questions. I will continue to follow up with the patient to assist them with progression and safety.

## 2019-10-31 NOTE — Progress Notes (Signed)
Daily Session Note  Patient Details  Name: JINAN BIGGINS MRN: [id number] Date of Birth: 09/02/1960 Referring Provider:     Pulmonary Rehab Walk Test from 10/02/2019 in [place]  Referring Provider  Dr. Melvyn Novas      Encounter Date: 10/31/2019  Check In: Session Check In - 10/31/19 1044      Check-In   Supervising physician immediately available to respond to emergencies  [facility] immediately available    Physician(s)  Dr. Posey Pronto    Location  [facility]    Staff Present  Rosebud Poles, RN, Bjorn Loser, MS, Exercise Physiologist;Lisa Ysidro Evert, RN    Virtual Visit  No    Medication changes reported      No    Fall or balance concerns reported     No    Tobacco Cessation  No Change    Warm-up and Cool-down  Performed on first and last piece of equipment    Resistance Training Performed  Yes    VAD Patient?  No    PAD/SET Patient?  No      Pain Assessment   Currently in Pain?  No/denies    Multiple Pain Sites  No       Capillary Blood Glucose: No results found for this or any previous visit (from the past 24 hour(s)).  Exercise Prescription Changes - 10/31/19 1100      Home Exercise Plan   Plans to continue exercise at  Home (comment)    Frequency  Add 2 additional days to program exercise sessions.    Initial Home Exercises Provided  10/31/19       Social History   Tobacco Use  Smoking Status Former Smoker  . Packs/day: 1.00  . Years: 35.00  . Pack years: 35.00  . Quit date: 04/13/2019  . Years since quitting: 0.5  Smokeless Tobacco Never Used    Goals Met:  Independence with exercise equipment Exercise tolerated well Strength training completed today  Goals Unmet:  Not Applicable  Comments: Service time is from 1045 to 1145    Dr. Fransico Him is Medical Director for Cardiac Rehab at [facility].

## 2019-11-03 DIAGNOSIS — G4734 Idiopathic sleep related nonobstructive alveolar hypoventilation: Secondary | ICD-10-CM

## 2019-11-04 NOTE — Telephone Encounter (Signed)
The overnight oximetry test on 4/28 showed oxygen saturation of 88% or less for about 34 minutes. This meets standard criteria for ordering home oxygen to wear every night while sleeping. I will order this for her. She is followed for COPD by Dr Sherene Sires and has an appointment with him in June. I only saw her for sleep consultation to rule out sleep apnea, so I won't be seeing her again unless she wants to follow with me.  Please order new DME  New O2 2L for sleep     Dx COPD mixed type

## 2019-11-05 ENCOUNTER — Encounter (HOSPITAL_COMMUNITY)
Admission: RE | Admit: 2019-11-05 | Discharge: 2019-11-05 | Disposition: A | Discharge status: Home / Self Care | Payer: 59 | Source: Ambulatory Visit | Attending: Internal Medicine | Admitting: Internal Medicine

## 2019-11-05 ENCOUNTER — Other Ambulatory Visit: Payer: Self-pay

## 2019-11-05 DIAGNOSIS — J449 Chronic obstructive pulmonary disease, unspecified: Secondary | ICD-10-CM | POA: Diagnosis not present

## 2019-11-05 NOTE — Addendum Note (Signed)
Addended by: Cydney Ok on: 11/05/2019 09:01 AM   Modules accepted: Orders

## 2019-11-05 NOTE — Progress Notes (Signed)
Daily Session Note  Patient Details  Name: Ann Richardson MRN: [id number] Date of Birth: 05-Jan-1961 Referring Provider:     Pulmonary Rehab Walk Test from 10/02/2019 in [place]  Referring Provider  Dr. Melvyn Novas      Encounter Date: 11/05/2019  Check In: Session Check In - 11/05/19 1127      Check-In   Supervising physician immediately available to respond to emergencies  [facility] immediately available    Physician(s)  Dr. Posey Pronto    Location  [facility]    Staff Present  Rosebud Poles, RN, Bjorn Loser, MS, Exercise Physiologist;Lisa Ysidro Evert, RN    Virtual Visit  No    Medication changes reported      No    Fall or balance concerns reported     No    Tobacco Cessation  No Change    Warm-up and Cool-down  Performed on first and last piece of equipment    Resistance Training Performed  Yes    VAD Patient?  No    PAD/SET Patient?  No      Pain Assessment   Currently in Pain?  No/denies    Multiple Pain Sites  No       Capillary Blood Glucose: No results found for this or any previous visit (from the past 24 hour(s)).    Social History   Tobacco Use  Smoking Status Former Smoker  . Packs/day: 1.00  . Years: 35.00  . Pack years: 35.00  . Quit date: 04/13/2019  . Years since quitting: 0.5  Smokeless Tobacco Never Used    Goals Met:  Independence with exercise equipment Exercise tolerated well Strength training completed today  Goals Unmet:  Not Applicable  Comments: Service time is from 1048 to 1145    Dr. Fransico Him is Medical Director for Cardiac Rehab at [facility].

## 2019-11-05 NOTE — Progress Notes (Signed)
Nutrition Note  Spoke with pt to review food log.  Short term nutrition goals met by incorporating fruit/veggies daily. Today we discussed barriers and successes with diet changes.  Reviewed a heart healthy/carb modified diet as pt wants to reduce cardiovascular risk and delay diabetes progression.  Identified areas of improvement. Goal to incorporate vegetables BID and fruit BID. Incorporate olive oil or nuts daily. Continue decreasing processed meats.  We reviewed label reading and recommended pt start checking her labels even on foods bought in the "healthy" section of the grocery store. Pt involved in goal setting and will continue to keep food log for RD to review.  Michaele Offer, MS, RDN, LDN

## 2019-11-07 ENCOUNTER — Encounter (HOSPITAL_COMMUNITY)
Admission: RE | Admit: 2019-11-07 | Discharge: 2019-11-07 | Disposition: A | Discharge status: Home / Self Care | Payer: 59 | Source: Ambulatory Visit | Attending: Internal Medicine | Admitting: Internal Medicine

## 2019-11-07 ENCOUNTER — Other Ambulatory Visit: Payer: Self-pay

## 2019-11-07 DIAGNOSIS — J449 Chronic obstructive pulmonary disease, unspecified: Secondary | ICD-10-CM | POA: Diagnosis not present

## 2019-11-07 NOTE — Progress Notes (Signed)
Daily Session Note  Patient Details  Name: Ann Richardson MRN: [id number] Date of Birth: Oct 06, 1960 Referring Provider:     Pulmonary Rehab Walk Test from 10/02/2019 in [place]  Referring Provider  Dr. Melvyn Novas      Encounter Date: 11/07/2019  Check In: Session Check In - 11/07/19 1130      Check-In   Supervising physician immediately available to respond to emergencies  [facility] immediately available    Physician(s)  Dr. Cyndia Skeeters    Location  [facility]    Staff Present  Rosebud Poles, RN, Roque Cash, RN    Virtual Visit  No    Medication changes reported      No    Fall or balance concerns reported     No    Tobacco Cessation  No Change    Warm-up and Cool-down  Performed as group-led instruction    Resistance Training Performed  Yes    VAD Patient?  No    PAD/SET Patient?  No      Pain Assessment   Currently in Pain?  No/denies    Multiple Pain Sites  No       Capillary Blood Glucose: No results found for this or any previous visit (from the past 24 hour(s)).    Social History   Tobacco Use  Smoking Status Former Smoker  . Packs/day: 1.00  . Years: 35.00  . Pack years: 35.00  . Quit date: 04/13/2019  . Years since quitting: 0.5  Smokeless Tobacco Never Used    Goals Met:  Exercise tolerated well No report of cardiac concerns or symptoms Strength training completed today  Goals Unmet:  Not Applicable  Comments: Service time is from 1040 to 1120     Dr. Fransico Him is Medical Director for Cardiac Rehab at [facility].

## 2019-11-12 ENCOUNTER — Encounter (HOSPITAL_COMMUNITY)
Admission: RE | Admit: 2019-11-12 | Discharge: 2019-11-12 | Disposition: A | Discharge status: Home / Self Care | Payer: 59 | Source: Ambulatory Visit | Attending: Internal Medicine | Admitting: Internal Medicine

## 2019-11-12 ENCOUNTER — Other Ambulatory Visit: Payer: Self-pay

## 2019-11-12 VITALS — Wt 153.2 lb

## 2019-11-12 DIAGNOSIS — J449 Chronic obstructive pulmonary disease, unspecified: Secondary | ICD-10-CM

## 2019-11-12 NOTE — Progress Notes (Signed)
Daily Session Note  Patient Details  Name: Ann Richardson MRN: [id number] Date of Birth: Feb 12, 1961 Referring Provider:     Pulmonary Rehab Walk Test from 10/02/2019 in [place]  Referring Provider  Dr. Melvyn Novas      Encounter Date: 11/12/2019  Check In: Session Check In - 11/12/19 1119      Check-In   Supervising physician immediately available to respond to emergencies  [facility] immediately available    Physician(s)  Dr. Cyndia Skeeters    Location  [facility]    Staff Present  Hoy Register, MS, CEP, Exercise Physiologist;Candler Ginsberg Ysidro Evert, RN;Joan Leonia Reeves, RN, BSN    Virtual Visit  No    Medication changes reported      No    Fall or balance concerns reported     No    Warm-up and Cool-down  Performed as group-led Higher education careers adviser Performed  Yes    VAD Patient?  No    PAD/SET Patient?  No      Pain Assessment   Currently in Pain?  No/denies    Multiple Pain Sites  No       Capillary Blood Glucose: No results found for this or any previous visit (from the past 24 hour(s)).  Exercise Prescription Changes - 11/12/19 1200      Response to Exercise   Blood Pressure (Admit)  132/74    Blood Pressure (Exercise)  180/76    Blood Pressure (Exit)  122/68    Heart Rate (Admit)  91 bpm    Heart Rate (Exercise)  132 bpm    Heart Rate (Exit)  95 bpm    Oxygen Saturation (Admit)  93 %    Oxygen Saturation (Exercise)  90 %    Oxygen Saturation (Exit)  100 %    Rating of Perceived Exertion (Exercise)  12    Perceived Dyspnea (Exercise)  3    Duration  Continue with 30 min of aerobic exercise without signs/symptoms of physical distress.    Intensity  THRR unchanged      Progression   Progression  Continue to progress workloads to maintain intensity without signs/symptoms of physical distress.      Resistance Training   Training Prescription  Yes    Weight  orange bands    Reps  10-15    Time  10 Minutes       Interval Training   Interval Training  No      Treadmill   MPH  2    Grade  1    Minutes  15      NuStep   Level  4    SPM  80    Minutes  15    METs  2.4       Social History   Tobacco Use  Smoking Status Former Smoker  . Packs/day: 1.00  . Years: 35.00  . Pack years: 35.00  . Quit date: 04/13/2019  . Years since quitting: 0.5  Smokeless Tobacco Never Used    Goals Met:  Exercise tolerated well No report of cardiac concerns or symptoms Strength training completed today  Goals Unmet:  Not Applicable  Comments: Service time is from 1040 to 1140    Dr. Fransico Him is Medical Director for Cardiac Rehab at [facility].

## 2019-11-13 NOTE — Progress Notes (Signed)
Pulmonary Individual Treatment Plan  Patient Details  Name: Ann Richardson MRN: [id number] Date of Birth: 06/10/1961 Referring Provider:     Pulmonary Rehab Walk Test from 10/02/2019 in [facility]  Referring Provider  Dr. 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  albuterol (PROAIR HFA) 108 (90 Base) MCG/ACT inhaler, Inhale 2 puffs into the lungs every 6 (six) hours as needed for wheezing or shortness of breath., Disp: 6.7 g, Rfl: 6 .  albuterol (PROVENTIL HFA;VENTOLIN HFA) 108 (90 BASE) MCG/ACT inhaler, Inhale 2 puffs into the lungs every 6 (six) hours as needed for wheezing or shortness of breath., Disp: , Rfl:  .  ALPRAZolam (XANAX) 1 MG tablet, Take 1 mg by mouth at bedtime as needed. , Disp: , Rfl:  .  ascorbic acid (VITAMIN C) 1000 MG tablet, Vitamin C 1,000 mg tablet  Take 3 tablets every day by oral route., Disp: , Rfl:  .  Budeson-Glycopyrrol-Formoterol (BREZTRI AEROSPHERE) 160-9-4.8 MCG/ACT AERO, Inhale 2 puffs into the lungs 2 (two) times daily., Disp: 10.7 g, Rfl: 11 .  famotidine (PEPCID) 20 MG tablet, One after supper, Disp: 30 tablet, Rfl: 11 .  metoprolol tartrate (LOPRESSOR) 100 MG tablet, Take 1 pill (100 mg) 2 hours before your coronary CT, Disp: 1 tablet, Rfl: 0 .  morphine (MSIR) 15 MG tablet, Take by mouth., Disp: , Rfl:  .  ondansetron (ZOFRAN) 4 mg TABS tablet, , Disp: , Rfl:  .  pantoprazole (PROTONIX) 40 MG tablet, Take 1 tablet (40 mg total) by mouth daily. Take 30-60 min before first meal of the day, Disp: 30 tablet, Rfl: 2 .  rosuvastatin (CRESTOR) 10 MG tablet, Take 1 tablet (10 mg total) by mouth daily., Disp: 90 tablet, Rfl: 3 .  Vitamin D, Ergocalciferol,  (DRISDOL) 1.25 MG (50000 UNIT) CAPS capsule, Take 50,000 Units by mouth once a week., Disp: , Rfl:  .  Zoster Vaccine Adjuvanted (SHINGRIX) injection, Shingrix (PF) 50 mcg/0.5 mL intramuscular suspension, kit  ADMINISTER 0.5ML IN THE MUSCLE AS DIRECTED, Disp: , Rfl:   Past Medical History: Past Medical History:  Diagnosis Date  . Arthritis    spine- degenerative   . COPD (chronic obstructive pulmonary disease) ([place])    CXR- 05/2016  . Hyperlipidemia   . 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 Pre-diabetes    pt. reports last A1C - 5/5, had been higher at one time     Tobacco Use: Social History   Tobacco Use  Smoking Status Former Smoker  . Packs/day: 1.00  . Years: 35.00  . Pack years: 35.00  . Quit date: 04/13/2019  . Years since quitting: 0.5  Smokeless Tobacco Never Used    Labs: Recent Review Scientist, physiological    Labs for ITP Cardiac and Pulmonary Rehab Latest Ref Rng & Units 06/09/2016   Hemoglobin A1c 4.8 - 5.6 % 5.4      Capillary Blood Glucose: No results found for: GLUCAP   Pulmonary Assessment Scores: Pulmonary Assessment Scores    Row Name 10/02/19 0954 10/02/19 1042       ADL UCSD   ADL Phase  Entry  Entry    SOB  Score total  45  --      CAT Score   CAT Score  15  --      mMRC Score   mMRC Score  --  3      UCSD: Self-administered rating of dyspnea associated with activities of daily living (ADLs) 6-point scale (0 = "not at all" to 5 = "maximal or unable to do because of breathlessness")  Scoring Scores range from 0 to 120.  Minimally important difference is 5 units  CAT: CAT can identify the health impairment of COPD patients and is better correlated with disease progression.  CAT has a scoring range of zero to 40. The CAT score is classified into four groups of low (less than 10), medium (10 - 20), high (21-30) and very high (31-40) based on the impact level of disease on health status. A CAT score over 10 suggests  significant symptoms.  A worsening CAT score could be explained by an exacerbation, poor medication adherence, poor inhaler technique, or progression of COPD or comorbid conditions.  CAT MCID is 2 points  mMRC: mMRC (Modified Medical Research Council) Dyspnea Scale is used to assess the degree of baseline functional disability in patients of respiratory disease due to dyspnea. No minimal important difference is established. A decrease in score of 1 point or greater is considered a positive change.   Pulmonary Function Assessment:   Exercise Target Goals: Exercise Program Goal: Individual exercise prescription set using results from initial 6 min walk test and THRR while considering  patient's activity barriers and safety.   Exercise Prescription Goal: Initial exercise prescription builds to 30-45 minutes a day of aerobic activity, 2-3 days per week.  Home exercise guidelines will be given to patient during program as part of exercise prescription that the participant will acknowledge.  Activity Barriers & Risk Stratification: Activity Barriers & Cardiac Risk Stratification - 10/02/19 0950      Activity Barriers & Cardiac Risk Stratification   Activity Barriers  Arthritis;Back Problems;Muscular Weakness;Deconditioning;Shortness of Breath       6 Minute Walk: 6 Minute Walk    Row Name 10/02/19 1043         6 Minute Walk   Phase  Initial     Distance  1010 feet     Walk Time  6 minutes     # of Rest Breaks  0     MPH  1.91     METS  2.89     RPE  12     Perceived Dyspnea   3     VO2 Peak  10.13     Symptoms  No     Resting HR  83 bpm     Resting BP  110/66     Resting Oxygen Saturation   93 %     Exercise Oxygen Saturation  during 6 min walk  88 %     Max Ex. HR  111 bpm     Max Ex. BP  134/62     2 Minute Post BP  110/64       Interval HR   1 Minute HR  100     2 Minute HR  103     3 Minute HR  108     4 Minute HR  108     5 Minute HR  105     6 Minute HR  111      2 Minute Post HR  103  Interval Heart Rate?  Yes       Interval Oxygen   Interval Oxygen?  Yes     Baseline Oxygen Saturation %  93 %     1 Minute Oxygen Saturation %  91 %     1 Minute Liters of Oxygen  0 L     2 Minute Oxygen Saturation %  88 %     2 Minute Liters of Oxygen  0 L     3 Minute Oxygen Saturation %  90 %     3 Minute Liters of Oxygen  0 L     4 Minute Oxygen Saturation %  88 %     4 Minute Liters of Oxygen  0 L     5 Minute Oxygen Saturation %  90 %     5 Minute Liters of Oxygen  0 L     6 Minute Oxygen Saturation %  88 %     6 Minute Liters of Oxygen  0 L     2 Minute Post Oxygen Saturation %  94 %     2 Minute Post Liters of Oxygen  0 L        Oxygen Initial Assessment: Oxygen Initial Assessment - 10/02/19 1042      Home Oxygen   Home Oxygen Device  None    Sleep Oxygen Prescription  None    Home Exercise Oxygen Prescription  None    Home at Rest Exercise Oxygen Prescription  None    Compliance with Home Oxygen Use  Yes      Initial 6 min Walk   Oxygen Used  None      Program Oxygen Prescription   Program Oxygen Prescription  None      Intervention   Short Term Goals  To learn and exhibit compliance with exercise, home and travel O2 prescription;To learn and understand importance of monitoring SPO2 with pulse oximeter and demonstrate accurate use of the pulse oximeter.;To learn and understand importance of maintaining oxygen saturations>88%;To learn and demonstrate proper pursed lip breathing techniques or other breathing techniques.;To learn and demonstrate proper use of respiratory medications    Long  Term Goals  Exhibits compliance with exercise, home and travel O2 prescription;Verbalizes importance of monitoring SPO2 with pulse oximeter and return demonstration;Maintenance of O2 saturations>88%;Exhibits proper breathing techniques, such as pursed lip breathing or other method taught during program session;Compliance with respiratory  medication;Demonstrates proper use of MDI's       Oxygen Re-Evaluation: Oxygen Re-Evaluation    Row Name 10/15/19 0759 11/12/19 0737           Program Oxygen Prescription   Program Oxygen Prescription  None  None        Home Oxygen   Home Oxygen Device  None  None      Sleep Oxygen Prescription  None  None      Home Exercise Oxygen Prescription  None  None      Home at Rest Exercise Oxygen Prescription  None  None      Compliance with Home Oxygen Use  Yes  Yes        Goals/Expected Outcomes   Short Term Goals  To learn and exhibit compliance with exercise, home and travel O2 prescription;To learn and understand importance of monitoring SPO2 with pulse oximeter and demonstrate accurate use of the pulse oximeter.;To learn and understand importance of maintaining oxygen saturations>88%;To learn and demonstrate proper pursed lip breathing techniques or other breathing techniques.;To learn  and demonstrate proper use of respiratory medications  To learn and exhibit compliance with exercise, home and travel O2 prescription;To learn and understand importance of monitoring SPO2 with pulse oximeter and demonstrate accurate use of the pulse oximeter.;To learn and understand importance of maintaining oxygen saturations>88%;To learn and demonstrate proper pursed lip breathing techniques or other breathing techniques.;To learn and demonstrate proper use of respiratory medications      Long  Term Goals  Exhibits compliance with exercise, home and travel O2 prescription;Verbalizes importance of monitoring SPO2 with pulse oximeter and return demonstration;Maintenance of O2 saturations>88%;Exhibits proper breathing techniques, such as pursed lip breathing or other method taught during program session;Compliance with respiratory medication;Demonstrates proper use of MDI's  Exhibits compliance with exercise, home and travel O2 prescription;Verbalizes importance of monitoring SPO2 with pulse oximeter and  return demonstration;Maintenance of O2 saturations>88%;Exhibits proper breathing techniques, such as pursed lip breathing or other method taught during program session;Compliance with respiratory medication;Demonstrates proper use of MDI's      Goals/Expected Outcomes  compliance  compliance         Oxygen Discharge (Final Oxygen Re-Evaluation): Oxygen Re-Evaluation - 11/12/19 0737      Program Oxygen Prescription   Program Oxygen Prescription  None      Home Oxygen   Home Oxygen Device  None    Sleep Oxygen Prescription  None    Home Exercise Oxygen Prescription  None    Home at Rest Exercise Oxygen Prescription  None    Compliance with Home Oxygen Use  Yes      Goals/Expected Outcomes   Short Term Goals  To learn and exhibit compliance with exercise, home and travel O2 prescription;To learn and understand importance of monitoring SPO2 with pulse oximeter and demonstrate accurate use of the pulse oximeter.;To learn and understand importance of maintaining oxygen saturations>88%;To learn and demonstrate proper pursed lip breathing techniques or other breathing techniques.;To learn and demonstrate proper use of respiratory medications    Long  Term Goals  Exhibits compliance with exercise, home and travel O2 prescription;Verbalizes importance of monitoring SPO2 with pulse oximeter and return demonstration;Maintenance of O2 saturations>88%;Exhibits proper breathing techniques, such as pursed lip breathing or other method taught during program session;Compliance with respiratory medication;Demonstrates proper use of MDI's    Goals/Expected Outcomes  compliance       Initial Exercise Prescription: Initial Exercise Prescription - 10/02/19 1000      Date of Initial Exercise RX and Referring Provider   Date  10/02/19    Referring Provider  Dr. Melvyn Novas      Treadmill   MPH  1.8    Grade  1    Minutes  15      NuStep   Level  3    SPM  80    Minutes  15      Prescription Details    Frequency (times per week)  2    Duration  Progress to 30 minutes of continuous aerobic without signs/symptoms of physical distress      Intensity   THRR 40-80% of Max Heartrate  64-128    Ratings of Perceived Exertion  11-13    Perceived Dyspnea  0-4      Progression   Progression  Continue progressive overload as per policy without signs/symptoms or physical distress.      Resistance Training   Training Prescription  Yes    Weight  orange bands    Reps  10-15       Perform Capillary Blood Glucose  checks as needed.  Exercise Prescription Changes: Exercise Prescription Changes    Row Name 10/15/19 1200 10/29/19 1200 10/31/19 1100 11/12/19 1200       Response to Exercise   Blood Pressure (Admit)  140/80  128/80  --  132/74    Blood Pressure (Exercise)  158/80  140/84  --  180/76    Blood Pressure (Exit)  128/82  132/84  --  122/68    Heart Rate (Admit)  100 bpm  92 bpm  --  91 bpm    Heart Rate (Exercise)  121 bpm  114 bpm  --  132 bpm    Heart Rate (Exit)  99 bpm  97 bpm  --  95 bpm    Oxygen Saturation (Admit)  93 %  97 %  --  93 %    Oxygen Saturation (Exercise)  89 %  90 %  --  90 %    Oxygen Saturation (Exit)  94 %  95 %  --  100 %    Rating of Perceived Exertion (Exercise)  12  12  --  12    Perceived Dyspnea (Exercise)  2.5  2.5  --  3    Duration  Continue with 30 min of aerobic exercise without signs/symptoms of physical distress.  Continue with 30 min of aerobic exercise without signs/symptoms of physical distress.  --  Continue with 30 min of aerobic exercise without signs/symptoms of physical distress.    Intensity  -- 40-80% HRR  THRR unchanged  --  THRR unchanged      Progression   Progression  Continue to progress workloads to maintain intensity without signs/symptoms of physical distress.  Continue to progress workloads to maintain intensity without signs/symptoms of physical distress.  --  Continue to progress workloads to maintain intensity without  signs/symptoms of physical distress.      Resistance Training   Training Prescription  Yes  Yes  --  Yes    Weight  orange bands  orange bands  --  orange bands    Reps  10-15  10-15  --  10-15    Time  10 Minutes  10 Minutes  --  10 Minutes      Interval Training   Interval Training  No  No  --  No      Treadmill   MPH  1.8  1.8  --  2    Grade  0  0  --  1    Minutes  15  15  --  15      NuStep   Level  3  3  --  4    SPM  80  80  --  80    Minutes  15  15  --  15    METs  1.8  2  --  2.4      Home Exercise Plan   Plans to continue exercise at  --  --  Home (comment)  --    Frequency  --  --  Add 2 additional days to program exercise sessions.  --    Initial Home Exercises Provided  --  --  10/31/19  --       Exercise Comments: Exercise Comments    Row Name 10/31/19 1151           Exercise Comments  home exercise complete          Exercise Goals and Review: Exercise Goals  [place] Name 10/02/19 1142 10/15/19 0759 11/12/19 0737         Exercise Goals   Increase Physical Activity  Yes  Yes  Yes     Intervention  Provide advice, education, support and counseling about physical activity/exercise needs.;Develop an individualized exercise prescription for aerobic and resistive training based on initial evaluation findings, risk stratification, comorbidities and participant's personal goals.  Provide advice, education, support and counseling about physical activity/exercise needs.;Develop an individualized exercise prescription for aerobic and resistive training based on initial evaluation findings, risk stratification, comorbidities and participant's personal goals.  Provide advice, education, support and counseling about physical activity/exercise needs.;Develop an individualized exercise prescription for aerobic and resistive training based on initial evaluation findings, risk stratification, comorbidities and participant's personal goals.     Expected Outcomes  Short  Term: Attend rehab on a regular basis to increase amount of physical activity.;Long Term: Add in home exercise to make exercise part of routine and to increase amount of physical activity.;Long Term: Exercising regularly at least 3-5 days a week.  Short Term: Attend rehab on a regular basis to increase amount of physical activity.;Long Term: Add in home exercise to make exercise part of routine and to increase amount of physical activity.;Long Term: Exercising regularly at least 3-5 days a week.  Short Term: Attend rehab on a regular basis to increase amount of physical activity.;Long Term: Add in home exercise to make exercise part of routine and to increase amount of physical activity.;Long Term: Exercising regularly at least 3-5 days a week.     Increase Strength and Stamina  Yes  Yes  Yes     Intervention  Provide advice, education, support and counseling about physical activity/exercise needs.;Develop an individualized exercise prescription for aerobic and resistive training based on initial evaluation findings, risk stratification, comorbidities and participant's personal goals.  Provide advice, education, support and counseling about physical activity/exercise needs.;Develop an individualized exercise prescription for aerobic and resistive training based on initial evaluation findings, risk stratification, comorbidities and participant's personal goals.  Provide advice, education, support and counseling about physical activity/exercise needs.;Develop an individualized exercise prescription for aerobic and resistive training based on initial evaluation findings, risk stratification, comorbidities and participant's personal goals.     Expected Outcomes  Short Term: Increase workloads from initial exercise prescription for resistance, speed, and METs.;Short Term: Perform resistance training exercises routinely during rehab and add in resistance training at home;Long Term: Improve cardiorespiratory fitness,  muscular endurance and strength as measured by increased METs and functional capacity (6MWT)  Short Term: Increase workloads from initial exercise prescription for resistance, speed, and METs.;Short Term: Perform resistance training exercises routinely during rehab and add in resistance training at home;Long Term: Improve cardiorespiratory fitness, muscular endurance and strength as measured by increased METs and functional capacity (6MWT)  Short Term: Increase workloads from initial exercise prescription for resistance, speed, and METs.;Short Term: Perform resistance training exercises routinely during rehab and add in resistance training at home;Long Term: Improve cardiorespiratory fitness, muscular endurance and strength as measured by increased METs and functional capacity (6MWT)     Able to understand and use rate of perceived exertion (RPE) scale  Yes  Yes  Yes     Intervention  Provide education and explanation on how to use RPE scale  Provide education and explanation on how to use RPE scale  Provide education and explanation on how to use RPE scale     Expected Outcomes  Short Term: Able to use RPE daily in rehab to express  subjective intensity level;Long Term:  Able to use RPE to guide intensity level when exercising independently  Short Term: Able to use RPE daily in rehab to express subjective intensity level;Long Term:  Able to use RPE to guide intensity level when exercising independently  Short Term: Able to use RPE daily in rehab to express subjective intensity level;Long Term:  Able to use RPE to guide intensity level when exercising independently     Able to understand and use Dyspnea scale  Yes  Yes  Yes     Intervention  Provide education and explanation on how to use Dyspnea scale  Provide education and explanation on how to use Dyspnea scale  Provide education and explanation on how to use Dyspnea scale     Expected Outcomes  Short Term: Able to use Dyspnea scale daily in rehab to express  subjective sense of shortness of breath during exertion;Long Term: Able to use Dyspnea scale to guide intensity level when exercising independently  Short Term: Able to use Dyspnea scale daily in rehab to express subjective sense of shortness of breath during exertion;Long Term: Able to use Dyspnea scale to guide intensity level when exercising independently  Short Term: Able to use Dyspnea scale daily in rehab to express subjective sense of shortness of breath during exertion;Long Term: Able to use Dyspnea scale to guide intensity level when exercising independently     Knowledge and understanding of Target Heart Rate Range (THRR)  Yes  Yes  Yes     Intervention  Provide education and explanation of THRR including how the numbers were predicted and where they are located for reference  Provide education and explanation of THRR including how the numbers were predicted and where they are located for reference  Provide education and explanation of THRR including how the numbers were predicted and where they are located for reference     Expected Outcomes  Short Term: Able to state/look up THRR;Short Term: Able to use daily as guideline for intensity in rehab;Long Term: Able to use THRR to govern intensity when exercising independently  Short Term: Able to state/look up THRR;Short Term: Able to use daily as guideline for intensity in rehab;Long Term: Able to use THRR to govern intensity when exercising independently  Short Term: Able to state/look up THRR;Short Term: Able to use daily as guideline for intensity in rehab;Long Term: Able to use THRR to govern intensity when exercising independently     Understanding of Exercise Prescription  Yes  Yes  Yes     Intervention  Provide education, explanation, and written materials on patient's individual exercise prescription  Provide education, explanation, and written materials on patient's individual exercise prescription  Provide education, explanation, and written  materials on patient's individual exercise prescription     Expected Outcomes  Short Term: Able to explain program exercise prescription;Long Term: Able to explain home exercise prescription to exercise independently  Short Term: Able to explain program exercise prescription;Long Term: Able to explain home exercise prescription to exercise independently  Short Term: Able to explain program exercise prescription;Long Term: Able to explain home exercise prescription to exercise independently        Exercise Goals Re-Evaluation : Exercise Goals Re-Evaluation    Row Name 10/15/19 0800 11/12/19 0738           Exercise Goal Re-Evaluation   Exercise Goals Review  Increase Physical Activity;Increase Strength and Stamina;Able to understand and use rate of perceived exertion (RPE) scale;Able to understand and use Dyspnea scale;Knowledge and  understanding of Target Heart Rate Range (THRR);Understanding of Exercise Prescription  Increase Physical Activity;Increase Strength and Stamina;Able to understand and use rate of perceived exertion (RPE) scale;Able to understand and use Dyspnea scale;Knowledge and understanding of Target Heart Rate Range (THRR);Understanding of Exercise Prescription      Comments  Pt has completed 2 exercise sessions. So far she is able to exercise on RA. It will be interesting to see if she can exercise on RA as we increase her intensity on the treadmill. She currently exercises at 1.8 METs on the stepper. Will continue to monitor and progress as able.  Pt has completed 8 exercise sessions. She has been able to increase her intensity on the treadmill while remaining on RA. She currently exercises at 2.3 METs on the stepper. Will continue to monitor and progress as able.      Expected Outcomes  Through exercise at rehab and at home, the patient will decrease shortness of breath with daily activities and feel confident in carrying out an exercise regime at home.  Through exercise at rehab  and at home, the patient will decrease shortness of breath with daily activities and feel confident in carrying out an exercise regime at home.         Discharge Exercise Prescription (Final Exercise Prescription Changes): Exercise Prescription Changes - 11/12/19 1200      Response to Exercise   Blood Pressure (Admit)  132/74    Blood Pressure (Exercise)  180/76    Blood Pressure (Exit)  122/68    Heart Rate (Admit)  91 bpm    Heart Rate (Exercise)  132 bpm    Heart Rate (Exit)  95 bpm    Oxygen Saturation (Admit)  93 %    Oxygen Saturation (Exercise)  90 %    Oxygen Saturation (Exit)  100 %    Rating of Perceived Exertion (Exercise)  12    Perceived Dyspnea (Exercise)  3    Duration  Continue with 30 min of aerobic exercise without signs/symptoms of physical distress.    Intensity  THRR unchanged      Progression   Progression  Continue to progress workloads to maintain intensity without signs/symptoms of physical distress.      Resistance Training   Training Prescription  Yes    Weight  orange bands    Reps  10-15    Time  10 Minutes      Interval Training   Interval Training  No      Treadmill   MPH  2    Grade  1    Minutes  15      NuStep   Level  4    SPM  80    Minutes  15    METs  2.4       Nutrition:  Target Goals: Understanding of nutrition guidelines, daily intake of sodium <1559m, cholesterol <208m calories 30% from fat and 7% or less from saturated fats, daily to have 5 or more servings of fruits and vegetables.  Biometrics: Pre Biometrics - 10/02/19 0951      Pre Biometrics   Height  5' 1"  (1.549 m)    Weight  152 lb 1.9 oz (69 kg)    BMI (Calculated)  28.76    Grip Strength  20 kg        Nutrition Therapy Plan and Nutrition Goals: Nutrition Therapy & Goals - 10/10/19 1400      Nutrition Therapy   Diet  General healthful/carb  modified      Personal Nutrition Goals   Nutrition Goal  Pt to identify food quantities necessary to  achieve weight loss of 6-24 lb at graduation from cardiac rehab.    Personal Goal #2  Pt to build a healthy plate including vegetables, fruits, whole grains, and low-fat dairy products in a heart healthy meal plan.      Intervention Plan   Intervention  Nutrition handout(s) given to patient.;Prescribe, educate and counsel regarding individualized specific dietary modifications aiming towards targeted core components such as weight, hypertension, lipid management, diabetes, heart failure and other comorbidities.    Expected Outcomes  Short Term Goal: A plan has been developed with personal nutrition goals set during dietitian appointment.;Long Term Goal: Adherence to prescribed nutrition plan.       Nutrition Assessments: Nutrition Assessments - 10/10/19 1401      Rate Your Plate Scores   Pre Score  48       Nutrition Goals Re-Evaluation: Nutrition Goals Re-Evaluation    Row Name 10/10/19 1401             Goals   Current Weight  152 lb (68.9 kg)       Nutrition Goal  Pt to identify food quantities necessary to achieve weight loss of 6-24 lb at graduation from cardiac rehab.         Personal Goal #2 Re-Evaluation   Personal Goal #2  Pt to build a healthy plate including vegetables, fruits, whole grains, and low-fat dairy products in a heart healthy meal plan.          Nutrition Goals Discharge (Final Nutrition Goals Re-Evaluation): Nutrition Goals Re-Evaluation - 10/10/19 1401      Goals   Current Weight  152 lb (68.9 kg)    Nutrition Goal  Pt to identify food quantities necessary to achieve weight loss of 6-24 lb at graduation from cardiac rehab.      Personal Goal #2 Re-Evaluation   Personal Goal #2  Pt to build a healthy plate including vegetables, fruits, whole grains, and low-fat dairy products in a heart healthy meal plan.       Psychosocial: Target Goals: Acknowledge presence or absence of significant depression and/or stress, maximize coping skills, provide  positive support system. Participant is able to verbalize types and ability to use techniques and skills needed for reducing stress and depression.  Initial Review & Psychosocial Screening: Initial Psych Review & Screening - 10/02/19 0955      Initial Review   Current issues with  None Identified      Family Dynamics   Good Support System?  Yes      Barriers   Psychosocial barriers to participate in program  There are no identifiable barriers or psychosocial needs.      Screening Interventions   Interventions  Encouraged to exercise       Quality of Life Scores:  Scores of 19 and below usually indicate a poorer quality of life in these areas.  A difference of  2-3 points is a clinically meaningful difference.  A difference of 2-3 points in the total score of the Quality of Life Index has been associated with significant improvement in overall quality of life, self-image, physical symptoms, and general health in studies assessing change in quality of life.  PHQ-9: Recent Review Flowsheet Data    Depression screen [facility] 2/9 10/02/2019   Decreased Interest 0   Down, Depressed, Hopeless 0   PHQ - 2 Score 0  Altered sleeping 0   Tired, decreased energy 0   Change in appetite 0   Feeling bad or failure about yourself  0   Trouble concentrating 0   Moving slowly or fidgety/restless 0   Suicidal thoughts 0   Difficult doing work/chores Not difficult at all     Interpretation of Total Score  Total Score Depression Severity:  1-4 = Minimal depression, 5-9 = Mild depression, 10-14 = Moderate depression, 15-19 = Moderately severe depression, 20-27 = Severe depression   Psychosocial Evaluation and Intervention: Psychosocial Evaluation - 10/02/19 1002      Psychosocial Evaluation & Interventions   Interventions  Encouraged to exercise with the program and follow exercise prescription    Comments  No psychosocial concerns identified.    Continue Psychosocial Services   No Follow up  required       Psychosocial Re-Evaluation: Psychosocial Re-Evaluation    [place] Name 10/15/19 0855 11/12/19 0910           Psychosocial Re-Evaluation   Current issues with  None Identified  None Identified      Comments  No concerns or barriers identified at this time.  No barriers or psychosocial concerns identified at this time.      Expected Outcomes  For patient to be free of barriers or psychosocial concerns while participating in pulmonary rehab.  For Juliauna to continue to have no psychosocial concerns while participating in pulmonary rehab.      Interventions  Encouraged to attend Pulmonary Rehabilitation for the exercise  Encouraged to attend Pulmonary Rehabilitation for the exercise      Continue Psychosocial Services   No Follow up required  No Follow up required         Psychosocial Discharge (Final Psychosocial Re-Evaluation): Psychosocial Re-Evaluation - 11/12/19 0910      Psychosocial Re-Evaluation   Current issues with  None Identified    Comments  No barriers or psychosocial concerns identified at this time.    Expected Outcomes  For Hyla to continue to have no psychosocial concerns while participating in pulmonary rehab.    Interventions  Encouraged to attend Pulmonary Rehabilitation for the exercise    Continue Psychosocial Services   No Follow up required       Education: Education Goals: Education classes will be provided on a weekly basis, covering required topics. Participant will state understanding/return demonstration of topics presented.  Learning Barriers/Preferences: Learning Barriers/Preferences - 10/02/19 1004      Learning Barriers/Preferences   Learning Barriers  None    Learning Preferences  Computer/Internet;Written Material       Education Topics: Risk Factor Reduction:  -Group instruction that is supported by a PowerPoint presentation. Instructor discusses the definition of a risk factor, different risk factors for pulmonary  disease, and how the heart and lungs work together.     Nutrition for Pulmonary Patient:  -Group instruction provided by PowerPoint slides, verbal discussion, and written materials to support subject matter. The instructor gives an explanation and review of healthy diet recommendations, which includes a discussion on weight management, recommendations for fruit and vegetable consumption, as well as protein, fluid, caffeine, fiber, sodium, sugar, and alcohol. Tips for eating when patients are short of breath are discussed.   Pursed Lip Breathing:  -Group instruction that is supported by demonstration and informational handouts. Instructor discusses the benefits of pursed lip and diaphragmatic breathing and detailed demonstration on how to preform both.     Oxygen Safety:  -Group instruction provided by PowerPoint,  verbal discussion, and written material to support subject matter. There is an overview of "What is Oxygen" and "Why do we need it".  Instructor also reviews how to create a safe environment for oxygen use, the importance of using oxygen as prescribed, and the risks of noncompliance. There is a brief discussion on traveling with oxygen and resources the patient may utilize.   Oxygen Equipment:  -Group instruction provided by [facility] Staff utilizing handouts, written materials, and equipment demonstrations.   Signs and Symptoms:  -Group instruction provided by written material and verbal discussion to support subject matter. Warning signs and symptoms of infection, stroke, and heart attack are reviewed and when to call the physician/911 reinforced. Tips for preventing the spread of infection discussed.   Advanced Directives:  -Group instruction provided by verbal instruction and written material to support subject matter. Instructor reviews Advanced Directive laws and proper instruction for filling out document.   Pulmonary Video:  -Group video education that reviews the  importance of medication and oxygen compliance, exercise, good nutrition, pulmonary hygiene, and pursed lip and diaphragmatic breathing for the pulmonary patient.   Exercise for the Pulmonary Patient:  -Group instruction that is supported by a PowerPoint presentation. Instructor discusses benefits of exercise, core components of exercise, frequency, duration, and intensity of an exercise routine, importance of utilizing pulse oximetry during exercise, safety while exercising, and options of places to exercise outside of rehab.     Pulmonary Medications:  -Verbally interactive group education provided by instructor with focus on inhaled medications and proper administration.   Anatomy and Physiology of the Respiratory System and Intimacy:  -Group instruction provided by PowerPoint, verbal discussion, and written material to support subject matter. Instructor reviews respiratory cycle and anatomical components of the respiratory system and their functions. Instructor also reviews differences in obstructive and restrictive respiratory diseases with examples of each. Intimacy, Sex, and Sexuality differences are reviewed with a discussion on how relationships can change when diagnosed with pulmonary disease. Common sexual concerns are reviewed.   PULMONARY REHAB CHRONIC OBSTRUCTIVE PULMONARY DISEASE from 11/05/2019 in [place]  Date  11/05/19  Educator  DF  Instruction Review Code  2- Demonstrated Understanding      MD DAY -A group question and answer session with a medical doctor that allows participants to ask questions that relate to their pulmonary disease state.   OTHER EDUCATION -Group or individual verbal, written, or video instructions that support the educational goals of the pulmonary rehab program.   PULMONARY REHAB CHRONIC OBSTRUCTIVE PULMONARY DISEASE from 11/05/2019 in [place]  Date  10/29/19  Educator  Mittie Bodo your numbers]  Instruction Review Code  -- [na]      Holiday Eating Survival Tips:  -Group instruction provided by PowerPoint slides, verbal discussion, and written materials to support subject matter. The instructor gives patients tips, tricks, and techniques to help them not only survive but enjoy the holidays despite the onslaught of food that accompanies the holidays.   Knowledge Questionnaire Score: Knowledge Questionnaire Score - 10/02/19 1004      Knowledge Questionnaire Score   Pre Score  16/18       Core Components/Risk Factors/Patient Goals at Admission: Personal Goals and Risk Factors at Admission - 10/02/19 1006      Core Components/Risk Factors/Patient Goals on Admission   Improve shortness of breath with ADL's  Yes    Intervention  Provide education, individualized exercise plan and daily activity instruction  to help decrease symptoms of SOB with activities of daily living.    Expected Outcomes  Short Term: Improve cardiorespiratory fitness to achieve a reduction of symptoms when performing ADLs;Long Term: Be able to perform more ADLs without symptoms or delay the onset of symptoms       Core Components/Risk Factors/Patient Goals Review:  Goals and Risk Factor Review    Row Name 10/15/19 0856 11/12/19 0911           Core Components/Risk Factors/Patient Goals Review   Personal Goals Review  Develop more efficient breathing techniques such as purse lipped breathing and diaphragmatic breathing and practicing self-pacing with activity.;Increase knowledge of respiratory medications and ability to use respiratory devices properly.;Improve shortness of breath with ADL's  Improve shortness of breath with ADL's;Develop more efficient breathing techniques such as purse lipped breathing and diaphragmatic breathing and practicing self-pacing with activity.;Increase knowledge of respiratory medications and ability to use respiratory devices properly.      Review  Annelise  has attended 1 exercise session, too early to see progression towards goals.  Is doing well, exercising without oxygen on the treadmill @ 5mh and 1% incline, and level 4 of the nustep @ 2.3 mets.      Expected Outcomes  See admission goals.  See admission goals.         Core Components/Risk Factors/Patient Goals at Discharge (Final Review):  Goals and Risk Factor Review - 11/12/19 0911      Core Components/Risk Factors/Patient Goals Review   Personal Goals Review  Improve shortness of breath with ADL's;Develop more efficient breathing techniques such as purse lipped breathing and diaphragmatic breathing and practicing self-pacing with activity.;Increase knowledge of respiratory medications and ability to use respiratory devices properly.    Review  Is doing well, exercising without oxygen on the treadmill @ 284m and 1% incline, and level 4 of the nustep @ 2.3 mets.    Expected Outcomes  See admission goals.       ITP Comments:   Comments: ITP REVIEW Pt is making expected progress toward pulmonary rehab goals after completing 9 sessions. Recommend continued exercise, life style modification, education, and utilization of breathing techniques to increase stamina and strength and decrease shortness of breath with exertion.

## 2019-11-14 ENCOUNTER — Encounter (HOSPITAL_COMMUNITY)
Admission: RE | Admit: 2019-11-14 | Discharge: 2019-11-14 | Disposition: A | Discharge status: Home / Self Care | Payer: 59 | Source: Ambulatory Visit | Attending: Internal Medicine | Admitting: Internal Medicine

## 2019-11-14 ENCOUNTER — Telehealth (HOSPITAL_COMMUNITY): Payer: Self-pay | Admitting: *Deleted

## 2019-11-14 ENCOUNTER — Other Ambulatory Visit: Payer: Self-pay

## 2019-11-14 ENCOUNTER — Other Ambulatory Visit: Payer: 59 | Admitting: *Deleted

## 2019-11-14 DIAGNOSIS — R0609 Other forms of dyspnea: Secondary | ICD-10-CM

## 2019-11-14 DIAGNOSIS — E785 Hyperlipidemia, unspecified: Secondary | ICD-10-CM

## 2019-11-14 DIAGNOSIS — J449 Chronic obstructive pulmonary disease, unspecified: Secondary | ICD-10-CM | POA: Diagnosis not present

## 2019-11-14 DIAGNOSIS — I272 Pulmonary hypertension, unspecified: Secondary | ICD-10-CM

## 2019-11-14 LAB — BASIC METABOLIC PANEL
BUN/Creatinine Ratio: 23 (ref 12–28)
BUN: 16 mg/dL (ref 8–27)
CO2: 28 mmol/L (ref 20–29)
Calcium: 9.4 mg/dL (ref 8.7–10.3)
Chloride: 101 mmol/L (ref 96–106)
Creatinine, Ser: 0.71 mg/dL (ref 0.57–1.00)
GFR calc Af Amer: 107 mL/min/{1.73_m2} (ref >59)
GFR calc non Af Amer: 93 mL/min/{1.73_m2} (ref >59)
Glucose: 79 mg/dL (ref 65–99)
Potassium: 4.1 mmol/L (ref 3.5–5.2)
Sodium: 138 mmol/L (ref 134–144)

## 2019-11-14 NOTE — Progress Notes (Signed)
Daily Session Note  Patient Details  Name: Ann Richardson MRN: [id number] Date of Birth: 12-Feb-1961 Referring Provider:     Pulmonary Rehab Walk Test from 10/02/2019 in [place]  Referring Provider  Dr. Melvyn Novas      Encounter Date: 11/14/2019  Check In: Session Check In - 11/14/19 1108      Check-In   Supervising physician immediately available to respond to emergencies  [facility] immediately available    Physician(s)  Dr. Tyrell Antonio    Location  [facility]    Staff Present  Michaele Offer RD, LDN;Dalton Kris Mouton, MS, CEP, Exercise Physiologist;Peyson Postema Ysidro Evert, RN    Virtual Visit  No    Medication changes reported      No    Fall or balance concerns reported     No    Tobacco Cessation  No Change    Warm-up and Cool-down  Performed on first and last piece of equipment    Resistance Training Performed  Yes    VAD Patient?  No    PAD/SET Patient?  No      Pain Assessment   Currently in Pain?  No/denies    Multiple Pain Sites  No       Capillary Blood Glucose: No results found for this or any previous visit (from the past 24 hour(s)).    Social History   Tobacco Use  Smoking Status Former Smoker  . Packs/day: 1.00  . Years: 35.00  . Pack years: 35.00  . Quit date: 04/13/2019  . Years since quitting: 0.5  Smokeless Tobacco Never Used    Goals Met:  Exercise tolerated well No report of cardiac concerns or symptoms Strength training completed today  Goals Unmet:  Not Applicable  Comments: Service time is from 1040 to 1140    Dr. Fransico Him is Medical Director for Cardiac Rehab at [facility].

## 2019-11-14 NOTE — Telephone Encounter (Signed)
Attempted to call patient regarding upcoming cardiac CT appointment. Left message on voicemail with name and callback number  Merle Tai RN Navigator Cardiac Imaging [facility] [phone] Office [phone] Cell  

## 2019-11-14 NOTE — Telephone Encounter (Signed)
Pt returning call regarding cardiac CT.  Instructions reviewed and pt expressed understanding.

## 2019-11-15 ENCOUNTER — Other Ambulatory Visit: Payer: 59

## 2019-11-15 ENCOUNTER — Ambulatory Visit (HOSPITAL_COMMUNITY)
Admission: RE | Admit: 2019-11-15 | Discharge: 2019-11-15 | Disposition: A | Discharge status: Home / Self Care | Payer: 59 | Source: Ambulatory Visit | Attending: Cardiovascular Disease | Admitting: Cardiovascular Disease

## 2019-11-15 DIAGNOSIS — I272 Pulmonary hypertension, unspecified: Secondary | ICD-10-CM

## 2019-11-15 DIAGNOSIS — R0609 Other forms of dyspnea: Secondary | ICD-10-CM

## 2019-11-15 DIAGNOSIS — R0600 Dyspnea, unspecified: Secondary | ICD-10-CM | POA: Insufficient documentation

## 2019-11-15 DIAGNOSIS — J439 Emphysema, unspecified: Secondary | ICD-10-CM | POA: Insufficient documentation

## 2019-11-15 DIAGNOSIS — I2510 Atherosclerotic heart disease of native coronary artery without angina pectoris: Secondary | ICD-10-CM | POA: Diagnosis not present

## 2019-11-15 DIAGNOSIS — I700 Atherosclerosis of aorta: Secondary | ICD-10-CM | POA: Diagnosis not present

## 2019-11-15 MED ORDER — METOPROLOL TARTRATE 5 MG/5ML IV SOLN: 5.0000 mg | INTRAVENOUS | Status: DC | PRN

## 2019-11-15 MED ORDER — METOPROLOL TARTRATE 5 MG/5ML IV SOLN
INTRAVENOUS | Status: AC
  Filled 2019-11-15: qty 10

## 2019-11-15 MED ORDER — IOHEXOL 350 MG/ML SOLN
80.0000 mL | Freq: Once | INTRAVENOUS | Status: AC | PRN
  Administered 2019-11-15: 80 mL via INTRAVENOUS

## 2019-11-15 MED ORDER — NITROGLYCERIN 0.4 MG SL SUBL
SUBLINGUAL_TABLET | SUBLINGUAL | Status: AC
  Administered 2019-11-15: 0.8 mg via SUBLINGUAL
  Filled 2019-11-15: qty 2

## 2019-11-15 MED ORDER — NITROGLYCERIN 0.4 MG SL SUBL: 0.8000 mg | SUBLINGUAL_TABLET | Freq: Once | SUBLINGUAL | Status: AC

## 2019-11-18 DIAGNOSIS — I272 Pulmonary hypertension, unspecified: Secondary | ICD-10-CM | POA: Diagnosis not present

## 2019-11-19 ENCOUNTER — Other Ambulatory Visit: Payer: Self-pay

## 2019-11-19 ENCOUNTER — Encounter (HOSPITAL_COMMUNITY)
Admission: RE | Admit: 2019-11-19 | Discharge: 2019-11-19 | Disposition: A | Discharge status: Home / Self Care | Payer: 59 | Source: Ambulatory Visit | Attending: Internal Medicine | Admitting: Internal Medicine

## 2019-11-19 DIAGNOSIS — J449 Chronic obstructive pulmonary disease, unspecified: Secondary | ICD-10-CM | POA: Diagnosis not present

## 2019-11-19 NOTE — Telephone Encounter (Signed)
Called and spoke with patient. She has been scheduled for a covid test on 6/2 and PFT on 6/4.

## 2019-11-19 NOTE — Telephone Encounter (Signed)
Received the following message from patient:   "Dr Sherene Sires -All results are in as far as sleep study & O2 study, I even went to a cardiologist since shortness of breath can be heart related - sleep study shows no sleep apnea, oxygen study stated low oxygen during the night so I'm on 2 liters of oxygen for sleep - cardio report some stenosis but nothing that has to do with shortness of breath - been in rehab since 4-7 2X a week - along with walking when weather permits, treadmill at my gym - and breathing is no better - i havent had a PFT since 2019 - it is possible the COPD has gotten worse? you stated many times to me that if i continued smoking the COPD would just get worse, and if I quit it wouldn't get worse but it wouldn't get better - I quit 03-2019 and was walking (exercising) a lot when all this started -- you thought it was gerd - still on medications you prescribed - that hasn't helped either - i need some answers since this has been going on since Jan - what is it? why it happened? wil it get better? worse? etc. plz advice"  MW, please advise. I did look to see if she was scheduled for a follow up. She is currently scheduled for a follow up with you on 12/16/19 at 845a.

## 2019-11-19 NOTE — Telephone Encounter (Signed)
Keep f/u appt with all meds in hand using a trust but verify approach to confirm accurate Medication  Reconciliation The principal here is that until we are certain that the  patients are doing what we've asked, it makes no sense to ask them to do more.   See if can get pfts done in meantime or same day

## 2019-11-19 NOTE — Progress Notes (Signed)
Daily Session Note  Patient Details  Name: Ann Richardson MRN: [id number] Date of Birth: 07/16/1960 Referring Provider:     Pulmonary Rehab Walk Test from 10/02/2019 in [facility]  Referring Provider  Dr. Wert      Encounter Date: 11/19/2019  Check In: Session Check In - 11/19/19 1105      Check-In   Supervising physician immediately available to respond to emergencies  [facility] immediately available    Physician(s)  Dr. Regalado    Location  [facility]    Staff Present  Joan Behrens, RN, BSN;Lisa Hughes, RN;Dalton Fletcher, MS, CEP, Exercise Physiologist    Virtual Visit  Yes    Medication changes reported      No    Fall or balance concerns reported     No    Tobacco Cessation  No Change    Warm-up and Cool-down  Performed as group-led instruction    Resistance Training Performed  Yes    VAD Patient?  No    PAD/SET Patient?  No      Pain Assessment   Currently in Pain?  No/denies    Multiple Pain Sites  No       Capillary Blood Glucose: No results found for this or any previous visit (from the past 24 hour(s)).    Social History   Tobacco Use  Smoking Status Former Smoker  . Packs/day: 1.00  . Years: 35.00  . Pack years: 35.00  . Quit date: 04/13/2019  . Years since quitting: 0.6  Smokeless Tobacco Never Used    Goals Met:  Independence with exercise equipment Exercise tolerated well Strength training completed today  Goals Unmet:  Not Applicable  Comments: Service time is from 1035 to 1135    Dr. Traci Turner is Medical Director for Cardiac Rehab at [facility]. 

## 2019-11-21 ENCOUNTER — Other Ambulatory Visit: Payer: Self-pay

## 2019-11-21 ENCOUNTER — Encounter (HOSPITAL_COMMUNITY)
Admission: RE | Admit: 2019-11-21 | Discharge: 2019-11-21 | Disposition: A | Discharge status: Home / Self Care | Payer: 59 | Source: Ambulatory Visit | Attending: Internal Medicine | Admitting: Internal Medicine

## 2019-11-21 DIAGNOSIS — J449 Chronic obstructive pulmonary disease, unspecified: Secondary | ICD-10-CM

## 2019-11-21 NOTE — Progress Notes (Signed)
Daily Session Note  Patient Details  Name: Ann Richardson MRN: [id number] Date of Birth: Aug 12, 1960 Referring Provider:     Pulmonary Rehab Walk Test from 10/02/2019 in [place]  Referring Provider  Dr. Melvyn Novas      Encounter Date: 11/21/2019  Check In: Session Check In - 11/21/19 1039      Check-In   Supervising physician immediately available to respond to emergencies  [facility] immediately available    Physician(s)  Dr. Broadus John    Location  [facility]    Staff Present  Rosebud Poles, RN, BSN;Tulani Kidney Ysidro Evert, RN;Dalton Kris Mouton, MS, CEP, Exercise Physiologist    Virtual Visit  No    Medication changes reported      No    Fall or balance concerns reported     No    Tobacco Cessation  No Change    Warm-up and Cool-down  Performed on first and last piece of equipment    Resistance Training Performed  Yes    VAD Patient?  No    PAD/SET Patient?  No      Pain Assessment   Currently in Pain?  No/denies    Multiple Pain Sites  No       Capillary Blood Glucose: No results found for this or any previous visit (from the past 24 hour(s)).    Social History   Tobacco Use  Smoking Status Former Smoker  . Packs/day: 1.00  . Years: 35.00  . Pack years: 35.00  . Quit date: 04/13/2019  . Years since quitting: 0.6  Smokeless Tobacco Never Used    Goals Met:  Exercise tolerated well No report of cardiac concerns or symptoms Strength training completed today  Goals Unmet:  Not Applicable  Comments: Service time is from 1035 to 1135    Dr. Fransico Him is Medical Director for Cardiac Rehab at [facility].

## 2019-11-26 ENCOUNTER — Other Ambulatory Visit: Payer: Self-pay

## 2019-11-26 ENCOUNTER — Encounter (HOSPITAL_COMMUNITY)
Admission: RE | Admit: 2019-11-26 | Discharge: 2019-11-26 | Disposition: A | Discharge status: Home / Self Care | Payer: 59 | Source: Ambulatory Visit | Attending: Internal Medicine | Admitting: Internal Medicine

## 2019-11-26 VITALS — Wt 154.3 lb

## 2019-11-26 DIAGNOSIS — J449 Chronic obstructive pulmonary disease, unspecified: Secondary | ICD-10-CM | POA: Diagnosis not present

## 2019-11-26 NOTE — Progress Notes (Signed)
Daily Session Note  Patient Details  Name: Ann Richardson MRN: [id number] Date of Birth: Sep 16, 1960 Referring Provider:     Pulmonary Rehab Walk Test from 10/02/2019 in [place]  Referring Provider  Dr. Melvyn Novas      Encounter Date: 11/26/2019  Check In: Session Check In - 11/26/19 1132      Check-In   Supervising physician immediately available to respond to emergencies  [facility] immediately available    Physician(s)  Dr. Broadus John    Location  [facility]    Staff Present  Rosebud Poles, RN, Bjorn Loser, MS, CEP, Exercise Physiologist;Annedrea Rosezella [place], RN, MHA    Virtual Visit  No    Medication changes reported      No    Fall or balance concerns reported     No    Tobacco Cessation  No Change    Warm-up and Cool-down  Performed on first and last piece of equipment    Resistance Training Performed  Yes    VAD Patient?  No    PAD/SET Patient?  No      Pain Assessment   Currently in Pain?  No/denies    Multiple Pain Sites  No       Capillary Blood Glucose: No results found for this or any previous visit (from the past 24 hour(s)).  Exercise Prescription Changes - 11/26/19 1100      Response to Exercise   Blood Pressure (Admit)  114/66    Blood Pressure (Exercise)  140/80    Blood Pressure (Exit)  120/68    Heart Rate (Admit)  94 bpm    Heart Rate (Exercise)  122 bpm    Heart Rate (Exit)  100 bpm    Oxygen Saturation (Admit)  96 %    Oxygen Saturation (Exercise)  90 %    Oxygen Saturation (Exit)  95 %    Rating of Perceived Exertion (Exercise)  13    Perceived Dyspnea (Exercise)  3    Duration  Continue with 30 min of aerobic exercise without signs/symptoms of physical distress.    Intensity  THRR unchanged      Progression   Progression  Continue to progress workloads to maintain intensity without signs/symptoms of physical distress.      Resistance Training   Training Prescription  Yes    Weight  orange bands    Reps  10-15    Time  10 Minutes      Interval Training   Interval Training  No      Treadmill   MPH  2    Grade  2    Minutes  15      NuStep   Level  4    SPM  80    Minutes  15    METs  2.2       Social History   Tobacco Use  Smoking Status Former Smoker  . Packs/day: 1.00  . Years: 35.00  . Pack years: 35.00  . Quit date: 04/13/2019  . Years since quitting: 0.6  Smokeless Tobacco Never Used    Goals Met:  Proper associated with RPD/PD & O2 Sat Exercise tolerated well Strength training completed today  Goals Unmet:  Not Applicable  Comments: Service time is from 1040 to 1145    Dr. Fransico Him is Medical Director for Cardiac Rehab at [facility].

## 2019-11-27 ENCOUNTER — Other Ambulatory Visit: Payer: Self-pay | Admitting: Internal Medicine

## 2019-11-27 ENCOUNTER — Other Ambulatory Visit (HOSPITAL_COMMUNITY)
Admission: RE | Admit: 2019-11-27 | Discharge: 2019-11-27 | Disposition: A | Discharge status: Home / Self Care | Payer: 59 | Source: Ambulatory Visit | Attending: Internal Medicine | Admitting: Internal Medicine

## 2019-11-27 DIAGNOSIS — Z20822 Contact with and (suspected) exposure to covid-19: Secondary | ICD-10-CM | POA: Insufficient documentation

## 2019-11-27 DIAGNOSIS — Z01812 Encounter for preprocedural laboratory examination: Secondary | ICD-10-CM | POA: Diagnosis present

## 2019-11-27 LAB — SARS CORONAVIRUS 2 (TAT 6-24 HRS): SARS Coronavirus 2: NEGATIVE

## 2019-11-28 ENCOUNTER — Other Ambulatory Visit: Payer: Self-pay

## 2019-11-28 ENCOUNTER — Encounter (HOSPITAL_COMMUNITY)
Admission: RE | Admit: 2019-11-28 | Discharge: 2019-11-28 | Disposition: A | Discharge status: Home / Self Care | Payer: 59 | Source: Ambulatory Visit | Attending: Internal Medicine | Admitting: Internal Medicine

## 2019-11-28 DIAGNOSIS — J449 Chronic obstructive pulmonary disease, unspecified: Secondary | ICD-10-CM

## 2019-11-28 NOTE — Progress Notes (Signed)
Daily Session Note  Patient Details  Name: Ann Richardson MRN: [id number] Date of Birth: 29-Mar-1961 Referring Provider:     Pulmonary Rehab Walk Test from 10/02/2019 in [place]  Referring Provider  Dr. Melvyn Novas      Encounter Date: 11/28/2019  Check In: Session Check In - 11/28/19 1146      Check-In   Supervising physician immediately available to respond to emergencies  [facility] immediately available    Physician(s)  Dr. Cyndia Skeeters    Location  [facility]    Staff Present  Rosebud Poles, RN, Bjorn Loser, MS, CEP, Exercise Physiologist;[facility] MS, EP-C, CCRP    Virtual Visit  No    Medication changes reported      No    Fall or balance concerns reported     No    Tobacco Cessation  No Change    Warm-up and Cool-down  Performed on first and last piece of equipment    Resistance Training Performed  Yes    VAD Patient?  No    PAD/SET Patient?  No      Pain Assessment   Currently in Pain?  No/denies    Multiple Pain Sites  No       Capillary Blood Glucose: No results found for this or any previous visit (from the past 24 hour(s)).    Social History   Tobacco Use  Smoking Status Former Smoker  . Packs/day: 1.00  . Years: 35.00  . Pack years: 35.00  . Quit date: 04/13/2019  . Years since quitting: 0.6  Smokeless Tobacco Never Used    Goals Met:  Independence with exercise equipment Exercise tolerated well Strength training completed today  Goals Unmet:  Not Applicable  Comments: Service time is from 1040 to 1140    Dr. Fransico Him is Medical Director for Cardiac Rehab at [facility].

## 2019-11-29 ENCOUNTER — Ambulatory Visit (INDEPENDENT_AMBULATORY_CARE_PROVIDER_SITE_OTHER): Payer: 59 | Admitting: Internal Medicine

## 2019-11-29 DIAGNOSIS — J449 Chronic obstructive pulmonary disease, unspecified: Secondary | ICD-10-CM | POA: Diagnosis not present

## 2019-11-29 LAB — PULMONARY FUNCTION TEST
DL/VA % pred: 62 %
DL/VA: 2.68 ml/min/mmHg/L
DLCO cor % pred: 59 %
DLCO cor: 10.83 ml/min/mmHg
DLCO unc % pred: 59 %
DLCO unc: 10.83 ml/min/mmHg
FEF 25-75 Post: 0.62 L/sec
FEF 25-75 Pre: 0.49 L/sec
FEF2575-%Change-Post: 26 %
FEF2575-%Pred-Post: 28 %
FEF2575-%Pred-Pre: 22 %
FEV1-%Change-Post: 9 %
FEV1-%Pred-Post: 54 %
FEV1-%Pred-Pre: 49 %
FEV1-Post: 1.24 L
FEV1-Pre: 1.13 L
FEV1FVC-%Change-Post: 5 %
FEV1FVC-%Pred-Pre: 69 %
FEV6-%Change-Post: 3 %
FEV6-%Pred-Post: 74 %
FEV6-%Pred-Pre: 71 %
FEV6-Post: 2.12 L
FEV6-Pre: 2.05 L
FEV6FVC-%Change-Post: 0 %
FEV6FVC-%Pred-Post: 101 %
FEV6FVC-%Pred-Pre: 102 %
FVC-%Change-Post: 4 %
FVC-%Pred-Post: 73 %
FVC-%Pred-Pre: 70 %
FVC-Post: 2.17 L
FVC-Pre: 2.08 L
Post FEV1/FVC ratio: 57 %
Post FEV6/FVC ratio: 98 %
Pre FEV1/FVC ratio: 54 %
Pre FEV6/FVC Ratio: 98 %
RV % pred: 165 %
RV: 3.04 L
TLC % pred: 114 %
TLC: 5.29 L

## 2019-11-29 NOTE — Progress Notes (Signed)
PFT done today. 

## 2019-12-02 NOTE — Progress Notes (Signed)
Spoke with pt and notified of results per Dr. Wert. Pt verbalized understanding and denied any questions. 

## 2019-12-03 ENCOUNTER — Other Ambulatory Visit: Payer: Self-pay

## 2019-12-03 ENCOUNTER — Encounter (HOSPITAL_COMMUNITY)
Admission: RE | Admit: 2019-12-03 | Discharge: 2019-12-03 | Disposition: A | Discharge status: Home / Self Care | Payer: 59 | Source: Ambulatory Visit | Attending: Internal Medicine | Admitting: Internal Medicine

## 2019-12-03 ENCOUNTER — Telehealth: Payer: Self-pay | Admitting: Internal Medicine

## 2019-12-03 DIAGNOSIS — J449 Chronic obstructive pulmonary disease, unspecified: Secondary | ICD-10-CM

## 2019-12-03 NOTE — Telephone Encounter (Signed)
Please do not call patient between 10 and 12, patient in Pulmonary rehab

## 2019-12-03 NOTE — Telephone Encounter (Signed)
Called and spoke with pt and explained why she is on O2 at night. Pt verbalized understanding. Nothing further needed.

## 2019-12-03 NOTE — Progress Notes (Signed)
Daily Session Note  Patient Details  Name: JAYELLE PAGE MRN: [id number] Date of Birth: Nov 17, 1960 Referring Provider:     Pulmonary Rehab Walk Test from 10/02/2019 in [place]  Referring Provider  Dr. Melvyn Novas      Encounter Date: 12/03/2019  Check In: Session Check In - 12/03/19 1208      Check-In   Supervising physician immediately available to respond to emergencies  [facility] immediately available    Physician(s)  Dr. Cruzita Lederer    Location  [facility]    Staff Present  Rosebud Poles, RN, BSN;Mckenzey Parcell Ysidro Evert, RN;Dalton Kris Mouton, MS, CEP, Exercise Physiologist    Virtual Visit  No    Medication changes reported      No    Fall or balance concerns reported     No    Tobacco Cessation  No Change    Warm-up and Cool-down  Performed as group-led instruction    Resistance Training Performed  Yes    VAD Patient?  No    PAD/SET Patient?  No      Pain Assessment   Currently in Pain?  No/denies    Multiple Pain Sites  No       Capillary Blood Glucose: No results found for this or any previous visit (from the past 24 hour(s)).    Social History   Tobacco Use  Smoking Status Former Smoker  . Packs/day: 1.00  . Years: 35.00  . Pack years: 35.00  . Quit date: 04/13/2019  . Years since quitting: 0.6  Smokeless Tobacco Never Used    Goals Met:  Exercise tolerated well No report of cardiac concerns or symptoms Strength training completed today  Goals Unmet:  Not Applicable  Comments: Service time is from 1045 to 1150    Dr. Fransico Him is Medical Director for Cardiac Rehab at [facility].

## 2019-12-05 ENCOUNTER — Encounter (HOSPITAL_COMMUNITY)
Admission: RE | Admit: 2019-12-05 | Discharge: 2019-12-05 | Disposition: A | Discharge status: Home / Self Care | Payer: 59 | Source: Ambulatory Visit | Attending: Internal Medicine | Admitting: Internal Medicine

## 2019-12-05 ENCOUNTER — Other Ambulatory Visit: Payer: Self-pay

## 2019-12-05 DIAGNOSIS — J449 Chronic obstructive pulmonary disease, unspecified: Secondary | ICD-10-CM

## 2019-12-16 ENCOUNTER — Ambulatory Visit (INDEPENDENT_AMBULATORY_CARE_PROVIDER_SITE_OTHER): Payer: 59 | Admitting: Internal Medicine

## 2019-12-16 ENCOUNTER — Other Ambulatory Visit: Payer: Self-pay

## 2019-12-16 ENCOUNTER — Encounter: Payer: Self-pay | Admitting: Internal Medicine

## 2019-12-16 DIAGNOSIS — R05 Cough: Secondary | ICD-10-CM

## 2019-12-16 DIAGNOSIS — R0600 Dyspnea, unspecified: Secondary | ICD-10-CM | POA: Diagnosis not present

## 2019-12-16 DIAGNOSIS — R0609 Other forms of dyspnea: Secondary | ICD-10-CM

## 2019-12-16 DIAGNOSIS — J449 Chronic obstructive pulmonary disease, unspecified: Secondary | ICD-10-CM | POA: Diagnosis not present

## 2019-12-16 DIAGNOSIS — R058 Other specified cough: Secondary | ICD-10-CM

## 2019-12-16 LAB — TSH: TSH: 0.62 u[IU]/mL (ref 0.35–4.50)

## 2019-12-16 LAB — BASIC METABOLIC PANEL
BUN: 10 mg/dL (ref 6–23)
CO2: 30 mEq/L (ref 19–32)
Calcium: 9.6 mg/dL (ref 8.4–10.5)
Chloride: 101 mEq/L (ref 96–112)
Creatinine, Ser: 0.73 mg/dL (ref 0.40–1.20)
GFR: 81.2 mL/min (ref >60.00)
Glucose, Bld: 141 mg/dL — ABNORMAL HIGH (ref 70–99)
Potassium: 4.2 mEq/L (ref 3.5–5.1)
Sodium: 138 mEq/L (ref 135–145)

## 2019-12-16 LAB — CBC WITH DIFFERENTIAL/PLATELET
Basophils Absolute: 0.1 10*3/uL (ref 0.0–0.1)
Basophils Relative: 0.7 % (ref 0.0–3.0)
Eosinophils Absolute: 0.3 10*3/uL (ref 0.0–0.7)
Eosinophils Relative: 2 % (ref 0.0–5.0)
HCT: 38.7 % (ref 36.0–46.0)
Hemoglobin: 13 g/dL (ref 12.0–15.0)
Lymphocytes Relative: 19.3 % (ref 12.0–46.0)
Lymphs Abs: 2.5 10*3/uL (ref 0.7–4.0)
MCHC: 33.6 g/dL (ref 30.0–36.0)
MCV: 93.8 fl (ref 78.0–100.0)
Monocytes Absolute: 1 10*3/uL (ref 0.1–1.0)
Monocytes Relative: 7.5 % (ref 3.0–12.0)
Neutro Abs: 9 10*3/uL — ABNORMAL HIGH (ref 1.4–7.7)
Neutrophils Relative %: 70.5 % (ref 43.0–77.0)
Platelets: 246 10*3/uL (ref 150.0–400.0)
RBC: 4.13 Mil/uL (ref 3.87–5.11)
RDW: 13 % (ref 11.5–15.5)
WBC: 12.8 10*3/uL — ABNORMAL HIGH (ref 4.0–10.5)

## 2019-12-16 MED ORDER — BREZTRI AEROSPHERE 160-9-4.8 MCG/ACT IN AERO: 2.0000 | INHALATION_SPRAY | Freq: Two times a day (BID) | RESPIRATORY_TRACT | 0 refills | Status: DC

## 2019-12-16 NOTE — Progress Notes (Signed)
Spoke with the pt and notified of results per Dr Sherene Sires.

## 2019-12-16 NOTE — Patient Instructions (Addendum)
Over the counter zyrtec 10 mg daily as needed for cough and if not effective > For drainage / throat tickle try take CHLORPHENIRAMINE  4 mg  (Chlortab 4mg   at should be easiest to find in the green box)  take one every 4 hours as needed - available over the counter- may cause drowsiness so start with just a bedtime dose or two and see how you tolerate it before trying in daytime     Weight control is simply a matter of calorie balance which needs to be tilted in your favor by eating less and exercising more.  To get the most out of exercise, you need to be continuously aware that you are short of breath, but never out of breath, for 30 minutes daily. As you improve, it will actually be easier for you to do the same amount of exercise  in  30 minutes so always push to the level where you are short of breath.    Make sure you check your oxygen saturations at highest level of activity to be sure it stays over 90%    Work on inhaler technique:  relax and gently blow all the way out then take a nice smooth deep breath back in, triggering the inhaler at same time you start breathing in.  Hold for up to 5 seconds if you can. Blow out breztri  thru nose. Rinse and gargle with water when done.   Continue 02 at bedtime 2lpm until you see Dr [employer]   Please remember to go to the lab and x-ray department   for your tests - we will call you with the results when they are available.    Please schedule a follow up visit in 3 months but call sooner if needed  with all medications /inhalers/ solutions in hand so we can verify exactly what you are taking. This includes all medications from all doctors and over the counters

## 2019-12-16 NOTE — Progress Notes (Signed)
Subjective:    Patient ID: Ann Richardson, female   DOB: [birth year]   MRN: [id number]    Brief patient profile:  59  yowf  Bostonian   MM/ quit smoking 03/2019   never intense aerobic but able to do sports/ soft ball and did fine until around 2010 with doe with yardwork with assoc cough in addition has in frequency frequency episodes of bronchitis rx abx/ prednisone better then tried advair/ singulair no better so referred pulmonary clinic 11/26/2014 by  Cooper Render NP with dx GOLD II copd/ MM phenotype  01/08/15  With GOLD III criteria 10/30/2017 assoc with ongoing smoking   History of Present Illness  11/26/2014 1st [facility] office visit/ Ann Richardson   Chief Complaint  Patient presents with  . Pulmonary Consult    Referred by Cooper Render, NP. Pt states she has been dxed with Bronchitis x 3 so far in 2016. She c/o cough and SOB. She gets SOB with walking up stairs. Her cough is non prod at this time. She is using albuterol inhaler 2-3 x per wk on average.   no real change symptoms on advair but hfa poor  Cough quite a bit worse in am but not productive p last round of prednisone and her goal is to prevent future flares  rec Stop advair and singulair  Plan A = automatic = symbicort 80 Take 2 puffs first thing in am and then another 2 puffs about 12 hours later.  Plan B = backup - Only use your albuterol as a rescue medication   01/08/2015 f/u ov/Ann Richardson re: copd  GOLD II/ confused with meds/ names maint vs prns  - thinks she's still on advair  Chief Complaint  Patient presents with  . office visit    pft done, inhalers are helping, breathing not bad asit was going up stairs & 1st thing in the AM   poor mdi but doing better / cough some better but still some  Min white mucus production  rec You have GOLD II severity-  moderate and will continue to progress as long as you smoke - the key is to stop smoking now  Continue symbicort  160  Take 2 puffs first thing in am and then another 2  puffs about 12 hours later.  Work on inhaler technique:       10/30/2017  f/u ov/Ann Richardson re:  GOLD III copd now on symb 160 2bid Chief Complaint  Patient presents with  . Follow-up    Feeling better ,PFT today.  Dyspnea:  Limited by L leg in splint Cough: some first thing in am > mucoid Sleep: 1 pillows  SABA use: none since started on acid suppression    rec Pantoprazole (protonix) 40 mg   Take  30-60 min before first meal of the day and Pepcid (famotidine)  20 mg one @  bedtime until return to office - this is the best way to tell whether stomach acid is contributing to your problem.   Change symbicort to  Bevespi Take 2 puffs first thing in am and then another 2 puffs about 12 hours later.  Work on inhaler technique:  The key is to stop smoking completely before smoking completely stops you!     03/29/2018  f/u ov/Ann Richardson re: copd GOLD II/ still smoking  Chief Complaint  Patient presents with  . Follow-up    went to UC after last visit and was dxed with bronchitis. She states still having SOB and prod cough with  clear sputum.  She is using her ventolin 2 x daily on average.   75% better while on aug/pred then gradually worse  > eval by NP  02/27/18  rec Continue Bevespi- 2 puffs twice a day Add inhaled corticosteroid - Qvar 1 puff twice a day  Back up- Albuterol rescue inhaler  Start zyrtec daily as needed  Sending in RX dymista nasal spray   Some better while on dymista  and substituted flonase/atelin and some better then a lot worse 03/10/18 and seen in UC 03/17/18 with zpak / pred > seemed some better then worse off it  rec GERDdiet Stop bevespi and qvar  Plan A = Automatic = symbicort 160 Take 2 puffs first thing in am and then another 2 puffs about 12 hours later and spiriva 2 pffs each am  Plan B = Backup Only use your albuterol as a rescue medication  Plan C = Crisis - only use your albuterol nebulizer if you first try Plan B and it fails to help  For cough/ congestion >  mucinex dm  1200 mg every 12 hours and use the flutter valve as much as you can  Prednisone 10 mg Take 4 for three days 3 for three days 2 for three days 1 for three days and stop  chedule sinus CT > min edema L max Please schedule a follow up office visit in 6 weeks, call sooner if needed with all medications /inhalers/ solutions in hand    02/11/2019  f/u ov/Ann Richardson re:  GOLD II maint symb/ spriva/ still smoking but down to 4-5 per day on chantix  Chief Complaint  Patient presents with  . Follow-up    Patient reports that her breathing is doing well. She reports using her rescue inhaler with extertion. She also reports a cough when she gets too hot.  Dyspnea:  MMRC1 = can walk nl pace, flat grade, can't hurry or go uphills or steps s sob / worse with mask Works from home behind computer  Cough: some first thing in am x 15 min and productive white min vol Sleeping: no resp symptoms / flat bed / 2 pilows  SABA use: very rarely x with mask in grocery store seems to helps  02: none    08/30/2019  f/u ov/Ann Richardson re: GOLD II / no longer smoking / maint on symb/spiriva Chief Complaint  Patient presents with  . Follow-up    COPD GOLD II / Quit smoking April 15, 2019  Dyspnea:  Was doing walk around complex x 20 min consistently and then gradually lost ground since quit smoking, esp in last month - has been gaining wt and now doesn't leave house = MMRC4  = sob if tries to leave home or while getting dressed   Cough: less since quit with urge to clear throat but no mucus  Sleeping: able to lie flat / 2-3 pillows  SABA use: never prechallenges and never helps recover quicker  02: none  rec Plan A = Automatic = Always=    Breztri  Take 2 puffs first thing in am and then another 2 puffs about 12 hours later.  Plan B = Backup (to supplement plan A, not to replace it) Only use your albuterol inhaler as a rescue medication Prednisone 10 mg take  4 each am x 2 days,   2 each am x 2 days,  1 each am x 2  days and stop  Pantoprazole (protonix) 40 mg   Take  30-60  min before first meal of the day and Pepcid (famotidine)  20 mg one @  bedtime until return to office - this is the best way to tell whether stomach acid is contributing to your problem.   Try albuterol 15 min before an activity that you know would make you short of breath GERD.diet      12/16/2019  f/u ov/Kerri Kovacik re:  Still gold II copd maint on breztri / 02 hs only  Chief Complaint  Patient presents with  . Follow-up    SOB is about the same. Can't walk for more than 5 mins.   Dyspnea:  gxt 5 x weekly at least 20 min at 2.2 mph and 2 degrees with sats ok  Cough: still daytime urge to clear throat  No mucus, some better on gerd rx  Sleeping: able to lie flat 2-3 pillows  SABA use: rarely  - doesn't help  02: 2lpm hs    No obvious day to day or daytime variability or assoc excess/ purulent sputum or mucus plugs or hemoptysis or cp or chest tightness, subjective wheeze or overt sinus or hb symptoms.   Sleeping as above  without nocturnal  or early am exacerbation  of respiratory  c/o's or need for noct saba. Also denies any obvious fluctuation of symptoms with weather or environmental changes or other aggravating or alleviating factors except as outlined above   No unusual exposure hx or h/o childhood pna/ asthma or knowledge of premature birth.  Current Allergies, Complete Past Medical History, Past Surgical History, Family History, and Social History were reviewed in [employer] record.  ROS  The following are not active complaints unless bolded Hoarseness, sore throat, dysphagia, dental problems, itching, sneezing,  nasal congestion or discharge of excess mucus or purulent secretions, ear ache,   fever, chills, sweats, unintended wt loss or wt gain, classically pleuritic or exertional cp,  orthopnea pnd or arm/hand swelling  or leg swelling, presyncope, palpitations, abdominal pain, anorexia, nausea,  vomiting, diarrhea  or change in bowel habits or change in bladder habits, change in stools or change in urine, dysuria, hematuria,  rash, arthralgias, visual complaints, headache, numbness, weakness or ataxia or problems with walking or coordination,  change in mood or  memory.        Current Meds  Medication Sig  . albuterol (PROAIR HFA) 108 (90 Base) MCG/ACT inhaler Inhale 2 puffs into the lungs every 6 (six) hours as needed for wheezing or shortness of breath.  . ALPRAZolam (XANAX) 1 MG tablet Take 1 mg by mouth at bedtime as needed.   Marland Kitchen ascorbic acid (VITAMIN C) 1000 MG tablet Vitamin C 1,000 mg tablet  Take 3 tablets every day by oral route.  . Budeson-Glycopyrrol-Formoterol (BREZTRI AEROSPHERE) 160-9-4.8 MCG/ACT AERO Inhale 2 puffs into the lungs 2 (two) times daily.  . famotidine (PEPCID) 20 MG tablet One after supper  . morphine (MSIR) 15 MG tablet Take by mouth.  . ondansetron (ZOFRAN) 4 mg TABS tablet   . pantoprazole (PROTONIX) 40 MG tablet TAKE 1 TABLET BY MOUTH ONCE DAILY TAKE  30-60  MINUTES  BEFORE  FIRST  MEAL  OF  THE  DAY  . rosuvastatin (CRESTOR) 10 MG tablet Take 1 tablet (10 mg total) by mouth daily.  . Vitamin D, Ergocalciferol, (DRISDOL) 1.25 MG (50000 UNIT) CAPS capsule Take 50,000 Units by mouth once a week.                  Objective:  Physical Exam   12/16/2019        156   08/30/2019         151  02/11/2019        151  08/10/2018        131  05/10/2018      125 03/29/2018        122  02/06/2018       123 10/30/2017         127  09/22/2017       127   06/06/2016    130  04/10/15 141 lb 6.4 oz (64.139 kg)  01/08/15 140 lb (63.504 kg)  11/26/14 137 lb (62.143 kg)      Vital signs reviewed  12/16/2019  - Note at rest 02 sats  94% on RA       somber wf, still some throat clearing     HEENT : pt wearing mask not removed for exam due to covid - 19 concerns.    NECK :  without JVD/Nodes/TM/ nl carotid upstrokes bilaterally   LUNGS: no acc muscle use,   Mild barrel  contour chest wall with bilateral  Distant bs s audible wheeze and  without cough on insp or exp maneuvers  and mild  Hyperresonant  to  percussion bilaterally     CV:  RRR  no s3 or murmur or increase in P2, and no edema   ABD:  soft and nontender with pos end  insp Hoover's  in the supine position. No bruits or organomegaly appreciated, bowel sounds nl  MS:   Nl gait/  ext warm without deformities, calf tenderness, cyanosis or clubbing No obvious joint restrictions   SKIN: warm and dry without lesions    NEURO:  alert, approp, nl sensorium with  no motor or cerebellar deficits apparent.        I personally reviewed images and agree with radiology impression as follows:   Chest CT cuts on coronary Ct 11/15/19 Moderate centrilobular emphysema.  Labs ordered/ reviewed:      Chemistry      Component Value Date/Time   NA 138 12/16/2019 0928   NA 138 11/14/2019 1150   K 4.2 12/16/2019 0928   CL 101 12/16/2019 0928   CO2 30 12/16/2019 0928   BUN 10 12/16/2019 0928   BUN 16 11/14/2019 1150   CREATININE 0.73 12/16/2019 0928      Component Value Date/Time   CALCIUM 9.6 12/16/2019 0928   ALKPHOS 91 09/17/2019 0943   AST 19 09/17/2019 0943   ALT 20 09/17/2019 0943   BILITOT 0.4 09/17/2019 0943        Lab Results  Component Value Date   WBC 12.8 (H) 12/16/2019   HGB 13.0 12/16/2019   HCT 38.7 12/16/2019   MCV 93.8 12/16/2019   PLT 246.0 12/16/2019       Lab Results  Component Value Date   TSH 0.62 12/16/2019     Lab Results  Component Value Date   PROBNP 16.0 09/17/2019              Assessment:

## 2019-12-16 NOTE — Progress Notes (Signed)
Discharge Progress Report  Patient Details  Name: Ann Richardson MRN: [id number] Date of Birth: 06-06-1961 Referring Provider:     Pulmonary Rehab Walk Test from 10/02/2019 in [place]  Referring Provider Dr. Melvyn Novas       Number of Visits: 16  Reason for Discharge:  Patient reached a stable level of exercise. Patient independent in their exercise. Patient has met program and personal goals.  Smoking History:  Social History   Tobacco Use  Smoking Status Former Smoker  . Packs/day: 1.00  . Years: 35.00  . Pack years: 35.00  . Quit date: 04/13/2019  . 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 Melvyn Novas      Treadmill   MPH 1.8    Grade 1    Minutes 15      NuStep   Level 3    SPM 80    Minutes 15      Prescription Details   Frequency (times per week) 2    Duration Progress to 30 minutes of continuous aerobic without signs/symptoms of physical distress      Intensity   THRR 40-80% of Max Heartrate 64-128    Ratings of Perceived Exertion 11-13    Perceived Dyspnea 0-4      Progression   Progression Continue progressive overload as per policy without signs/symptoms or physical distress.      Resistance Training   Training Prescription Yes    Weight orange bands    Reps 10-15           Discharge Exercise Prescription (Final Exercise Prescription Changes):  Exercise Prescription Changes - 11/26/19 1100       Response to Exercise   Blood Pressure (Admit) 114/66    Blood Pressure (Exercise) 140/80    Blood Pressure (Exit) 120/68    Heart Rate (Admit) 94 bpm    Heart Rate (Exercise) 122 bpm    Heart Rate (Exit) 100 bpm    Oxygen Saturation (Admit) 96 %    Oxygen Saturation (Exercise) 90 %    Oxygen Saturation (Exit) 95 %    Rating of Perceived Exertion (Exercise) 13    Perceived Dyspnea (Exercise) 3    Duration Continue with 30 min of aerobic exercise without signs/symptoms of physical distress.    Intensity THRR unchanged      Progression   Progression Continue to progress workloads to maintain intensity without signs/symptoms of physical distress.      Resistance Training   Training Prescription Yes    Weight orange bands    Reps 10-15    Time 10 Minutes      Interval Training   Interval Training No      Treadmill   MPH 2    Grade 2  Minutes 15      NuStep   Level 4    SPM 80    Minutes 15    METs 2.2           Functional Capacity:  6 Minute Walk    Row Name 10/02/19 1043 12/05/19 1438       6 Minute Walk   Phase Initial Discharge    Distance 1010 feet 1214 feet    Distance % Change -- 20.2 %    Distance Feet Change -- 204 ft    Walk Time 6 minutes 6 minutes    # of Rest Breaks 0 0    MPH 1.91 2.3    METS 2.89 3.39    RPE 12 13    Perceived Dyspnea  3 3    VO2 Peak 10.13 11.85    Symptoms No No    Resting HR 83 bpm 90 bpm    Resting BP 110/66 144/80    Resting Oxygen Saturation  93 % 97 %    Exercise Oxygen Saturation  during 6 min walk 88 % 91 %    Max Ex. HR 111 bpm 114 bpm    Max Ex. BP 134/62 152/76    2 Minute Post BP 110/64 132/80      Interval HR   1 Minute HR 100 94    2 Minute HR 103 110    3 Minute HR 108 113    4 Minute HR 108 110    5 Minute HR 105 111    6 Minute HR 111 114    2 Minute Post HR 103 100    Interval Heart Rate? Yes --      Interval Oxygen   Interval Oxygen? Yes Yes    Baseline Oxygen Saturation % 93 % 97 %     1 Minute Oxygen Saturation % 91 % 95 %    1 Minute Liters of Oxygen 0 L 0 L    2 Minute Oxygen Saturation % 88 % 92 %    2 Minute Liters of Oxygen 0 L 0 L    3 Minute Oxygen Saturation % 90 % 92 %    3 Minute Liters of Oxygen 0 L 0 L    4 Minute Oxygen Saturation % 88 % 92 %    4 Minute Liters of Oxygen 0 L 0 L    5 Minute Oxygen Saturation % 90 % 91 %    5 Minute Liters of Oxygen 0 L 0 L    6 Minute Oxygen Saturation % 88 % 91 %    6 Minute Liters of Oxygen 0 L 0 L    2 Minute Post Oxygen Saturation % 94 % 94 %    2 Minute Post Liters of Oxygen 0 L 0 L           Psychological, QOL, Others - Outcomes: PHQ 2/9: Depression screen PHQ 2/9 10/02/2019  Decreased Interest 0  Down, Depressed, Hopeless 0  PHQ - 2 Score 0  Altered sleeping 0  Tired, decreased energy 0  Change in appetite 0  Feeling bad or failure about yourself  0  Trouble concentrating 0  Moving slowly or fidgety/restless 0  Suicidal thoughts 0  Difficult doing work/chores Not difficult at all    Quality of Life:   Personal Goals: Goals established at orientation with interventions provided to work toward goal.  Personal Goals and Risk Factors at Admission - 10/02/19 1006  Core Components/Risk Factors/Patient Goals on Admission   Improve shortness of breath with ADL's Yes    Intervention Provide education, individualized exercise plan and daily activity instruction to help decrease symptoms of SOB with activities of daily living.    Expected Outcomes Short Term: Improve cardiorespiratory fitness to achieve a reduction of symptoms when performing ADLs;Long Term: Be able to perform more ADLs without symptoms or delay the onset of symptoms            Personal Goals Discharge:  Goals and Risk Factor Review    Row Name 10/15/19 0856 11/12/19 0911           Core Components/Risk Factors/Patient Goals Review   Personal Goals Review Develop more efficient breathing techniques such as purse lipped breathing  and diaphragmatic breathing and practicing self-pacing with activity.;Increase knowledge of respiratory medications and ability to use respiratory devices properly.;Improve shortness of breath with ADL's Improve shortness of breath with ADL's;Develop more efficient breathing techniques such as purse lipped breathing and diaphragmatic breathing and practicing self-pacing with activity.;Increase knowledge of respiratory medications and ability to use respiratory devices properly.      Review Chyrl has attended 1 exercise session, too early to see progression towards goals. Is doing well, exercising without oxygen on the treadmill @ 79mh and 1% incline, and level 4 of the nustep @ 2.3 mets.      Expected Outcomes See admission goals. See admission goals.             Exercise Goals and Review:  Exercise Goals    Row Name 10/02/19 1142 10/15/19 0759 11/12/19 0737         Exercise Goals   Increase Physical Activity Yes Yes Yes     Intervention Provide advice, education, support and counseling about physical activity/exercise needs.;Develop an individualized exercise prescription for aerobic and resistive training based on initial evaluation findings, risk stratification, comorbidities and participant's personal goals. Provide advice, education, support and counseling about physical activity/exercise needs.;Develop an individualized exercise prescription for aerobic and resistive training based on initial evaluation findings, risk stratification, comorbidities and participant's personal goals. Provide advice, education, support and counseling about physical activity/exercise needs.;Develop an individualized exercise prescription for aerobic and resistive training based on initial evaluation findings, risk stratification, comorbidities and participant's personal goals.     Expected Outcomes Short Term: Attend rehab on a regular basis to increase amount of physical activity.;Long Term: Add in home  exercise to make exercise part of routine and to increase amount of physical activity.;Long Term: Exercising regularly at least 3-5 days a week. Short Term: Attend rehab on a regular basis to increase amount of physical activity.;Long Term: Add in home exercise to make exercise part of routine and to increase amount of physical activity.;Long Term: Exercising regularly at least 3-5 days a week. Short Term: Attend rehab on a regular basis to increase amount of physical activity.;Long Term: Add in home exercise to make exercise part of routine and to increase amount of physical activity.;Long Term: Exercising regularly at least 3-5 days a week.     Increase Strength and Stamina Yes Yes Yes     Intervention Provide advice, education, support and counseling about physical activity/exercise needs.;Develop an individualized exercise prescription for aerobic and resistive training based on initial evaluation findings, risk stratification, comorbidities and participant's personal goals. Provide advice, education, support and counseling about physical activity/exercise needs.;Develop an individualized exercise prescription for aerobic and resistive training based on initial evaluation findings, risk stratification, comorbidities and participant's personal goals.  Provide advice, education, support and counseling about physical activity/exercise needs.;Develop an individualized exercise prescription for aerobic and resistive training based on initial evaluation findings, risk stratification, comorbidities and participant's personal goals.     Expected Outcomes Short Term: Increase workloads from initial exercise prescription for resistance, speed, and METs.;Short Term: Perform resistance training exercises routinely during rehab and add in resistance training at home;Long Term: Improve cardiorespiratory fitness, muscular endurance and strength as measured by increased METs and functional capacity (6MWT) Short Term: Increase  workloads from initial exercise prescription for resistance, speed, and METs.;Short Term: Perform resistance training exercises routinely during rehab and add in resistance training at home;Long Term: Improve cardiorespiratory fitness, muscular endurance and strength as measured by increased METs and functional capacity (6MWT) Short Term: Increase workloads from initial exercise prescription for resistance, speed, and METs.;Short Term: Perform resistance training exercises routinely during rehab and add in resistance training at home;Long Term: Improve cardiorespiratory fitness, muscular endurance and strength as measured by increased METs and functional capacity (6MWT)     Able to understand and use rate of perceived exertion (RPE) scale Yes Yes Yes     Intervention Provide education and explanation on how to use RPE scale Provide education and explanation on how to use RPE scale Provide education and explanation on how to use RPE scale     Expected Outcomes Short Term: Able to use RPE daily in rehab to express subjective intensity level;Long Term:  Able to use RPE to guide intensity level when exercising independently Short Term: Able to use RPE daily in rehab to express subjective intensity level;Long Term:  Able to use RPE to guide intensity level when exercising independently Short Term: Able to use RPE daily in rehab to express subjective intensity level;Long Term:  Able to use RPE to guide intensity level when exercising independently     Able to understand and use Dyspnea scale Yes Yes Yes     Intervention Provide education and explanation on how to use Dyspnea scale Provide education and explanation on how to use Dyspnea scale Provide education and explanation on how to use Dyspnea scale     Expected Outcomes Short Term: Able to use Dyspnea scale daily in rehab to express subjective sense of shortness of breath during exertion;Long Term: Able to use Dyspnea scale to guide intensity level when  exercising independently Short Term: Able to use Dyspnea scale daily in rehab to express subjective sense of shortness of breath during exertion;Long Term: Able to use Dyspnea scale to guide intensity level when exercising independently Short Term: Able to use Dyspnea scale daily in rehab to express subjective sense of shortness of breath during exertion;Long Term: Able to use Dyspnea scale to guide intensity level when exercising independently     Knowledge and understanding of Target Heart Rate Range (THRR) Yes Yes Yes     Intervention Provide education and explanation of THRR including how the numbers were predicted and where they are located for reference Provide education and explanation of THRR including how the numbers were predicted and where they are located for reference Provide education and explanation of THRR including how the numbers were predicted and where they are located for reference     Expected Outcomes Short Term: Able to state/look up THRR;Short Term: Able to use daily as guideline for intensity in rehab;Long Term: Able to use THRR to govern intensity when exercising independently Short Term: Able to state/look up THRR;Short Term: Able to use daily as guideline for intensity in rehab;Long Term:  Able to use THRR to govern intensity when exercising independently Short Term: Able to state/look up THRR;Short Term: Able to use daily as guideline for intensity in rehab;Long Term: Able to use THRR to govern intensity when exercising independently     Understanding of Exercise Prescription Yes Yes Yes     Intervention Provide education, explanation, and written materials on patient's individual exercise prescription Provide education, explanation, and written materials on patient's individual exercise prescription Provide education, explanation, and written materials on patient's individual exercise prescription     Expected Outcomes Short Term: Able to explain program exercise prescription;Long  Term: Able to explain home exercise prescription to exercise independently Short Term: Able to explain program exercise prescription;Long Term: Able to explain home exercise prescription to exercise independently Short Term: Able to explain program exercise prescription;Long Term: Able to explain home exercise prescription to exercise independently            Exercise Goals Re-Evaluation:  Exercise Goals Re-Evaluation    Row Name 10/15/19 0800 11/12/19 0738           Exercise Goal Re-Evaluation   Exercise Goals Review Increase Physical Activity;Increase Strength and Stamina;Able to understand and use rate of perceived exertion (RPE) scale;Able to understand and use Dyspnea scale;Knowledge and understanding of Target Heart Rate Range (THRR);Understanding of Exercise Prescription Increase Physical Activity;Increase Strength and Stamina;Able to understand and use rate of perceived exertion (RPE) scale;Able to understand and use Dyspnea scale;Knowledge and understanding of Target Heart Rate Range (THRR);Understanding of Exercise Prescription      Comments Pt has completed 2 exercise sessions. So far she is able to exercise on RA. It will be interesting to see if she can exercise on RA as we increase her intensity on the treadmill. She currently exercises at 1.8 METs on the stepper. Will continue to monitor and progress as able. Pt has completed 8 exercise sessions. She has been able to increase her intensity on the treadmill while remaining on RA. She currently exercises at 2.3 METs on the stepper. Will continue to monitor and progress as able.      Expected Outcomes Through exercise at rehab and at home, the patient will decrease shortness of breath with daily activities and feel confident in carrying out an exercise regime at home. Through exercise at rehab and at home, the patient will decrease shortness of breath with daily activities and feel confident in carrying out an exercise regime at home.              Nutrition & Weight - Outcomes:  Pre Biometrics - 10/02/19 0951      Pre Biometrics   Height 5' 1"  (1.549 m)    Weight 69 kg    BMI (Calculated) 28.76    Grip Strength 20 kg            Nutrition:  Nutrition Therapy & Goals - 10/10/19 1400      Nutrition Therapy   Diet General healthful/carb modified      Personal Nutrition Goals   Nutrition Goal Pt to identify food quantities necessary to achieve weight loss of 6-24 lb at graduation from cardiac rehab.    Personal Goal #2 Pt to build a healthy plate including vegetables, fruits, whole grains, and low-fat dairy products in a heart healthy meal plan.      Intervention Plan   Intervention Nutrition handout(s) given to patient.;Prescribe, educate and counsel regarding individualized specific dietary modifications aiming towards targeted core components such as weight, hypertension, lipid management,  diabetes, heart failure and other comorbidities.    Expected Outcomes Short Term Goal: A plan has been developed with personal nutrition goals set during dietitian appointment.;Long Term Goal: Adherence to prescribed nutrition plan.           Nutrition Discharge:  Nutrition Assessments - 12/10/19 1154      Rate Your Plate Scores   Post Score 55           Education Questionnaire Score:  Knowledge Questionnaire Score - 12/05/19 1443      Knowledge Questionnaire Score   Post Score 16/18           Goals reviewed with patient; copy given to patient.

## 2019-12-17 ENCOUNTER — Encounter: Payer: Self-pay | Admitting: Internal Medicine

## 2019-12-17 NOTE — Assessment & Plan Note (Signed)
-   sinus CT  04/02/2018 Mild mucosal edema base of left maxillary sinus. Otherwise sinuses are clear  Reviewed approp use of H1 / gerd x

## 2019-12-17 NOTE — Assessment & Plan Note (Signed)
No evidence of chf or anemia or thyroid dz  - note she is gaining wt despite nl TSH so needs to work on reversing this trend through neg cal balance, reviewed.  Cards concerned about mild cor pulmonale but doubt this is the mechanism limiting ex capacity and plan to complete the eval with sleep study looking for causes other than copd to explain it which I support.   in meantime reminded: Make sure you check your oxygen saturations at highest level of activity to be sure it stays over 90% and adjust upward to maintain this level if needed but remember to turn it back to previous settings when you stop (to conserve your supply).    Each maintenance medication was reviewed in detail including emphasizing most importantly the difference between maintenance and prns and under what circumstances the prns are to be triggered using an action plan format where appropriate.  Total time for H and P, chart review, counseling, teaching device and generating customized AVS unique to this office visit / charting =  30 min

## 2019-12-17 NOTE — Assessment & Plan Note (Signed)
Quit smoking 03/2019    - 01/08/2015 pfts  FEV1  1.31 (54%) s am meds and ratio 56% p 9% improvement form saba with dlco 57 to 73% p correct for alv vol > rec symb 160 2bid  - alpha one  01/08/2015 > MM - PFT's  10/30/2017  FEV1 1.00 (43 % ) ratio 51  p 0 % improvement from saba p symbicort 160 x 2  prior to study with DLCO  39 % corrects to 52 % for alv volume  - 10/30/2017  After extensive coaching inhaler device  effectiveness =    75% > try bevespi 2bid   02/27/2018- Added Qvar 40 03/29/18  changed to symb 160/spiriva  And added flutter valve 03/2019 - quit smoking  08/30/2019  After extensive coaching inhaler device,  effectiveness =    75% try breztri 2 bid 09/17/2019 - referred to pulmonary rehab PFT's  11/29/19  FEV1 1.24 (54 % ) ratio 0.57  p 9 % improvement from saba p Breztri prior to study with DLCO  10.83 (59%) corrects to 2.68 (62%)  for alv volume and FV curve classic moderate concavity     Group D in terms of symptom/risk and laba/lama/ICS  therefore appropriate rx at this point >>>  Continue breztri/ reconditioning

## 2019-12-24 ENCOUNTER — Ambulatory Visit: Payer: 59 | Admitting: Internal Medicine

## 2020-01-10 ENCOUNTER — Other Ambulatory Visit: Payer: 59 | Admitting: *Deleted

## 2020-01-10 ENCOUNTER — Other Ambulatory Visit: Payer: Self-pay

## 2020-01-10 DIAGNOSIS — R0609 Other forms of dyspnea: Secondary | ICD-10-CM

## 2020-01-10 DIAGNOSIS — E785 Hyperlipidemia, unspecified: Secondary | ICD-10-CM

## 2020-01-10 DIAGNOSIS — I272 Pulmonary hypertension, unspecified: Secondary | ICD-10-CM

## 2020-01-10 LAB — BASIC METABOLIC PANEL
BUN/Creatinine Ratio: 17 (ref 12–28)
BUN: 11 mg/dL (ref 8–27)
CO2: 23 mmol/L (ref 20–29)
Calcium: 8.9 mg/dL (ref 8.7–10.3)
Chloride: 105 mmol/L (ref 96–106)
Creatinine, Ser: 0.65 mg/dL (ref 0.57–1.00)
GFR calc Af Amer: 112 mL/min/{1.73_m2} (ref >59)
GFR calc non Af Amer: 97 mL/min/{1.73_m2} (ref >59)
Glucose: 121 mg/dL — ABNORMAL HIGH (ref 65–99)
Potassium: 3.9 mmol/L (ref 3.5–5.2)
Sodium: 141 mmol/L (ref 134–144)

## 2020-01-10 LAB — HEPATIC FUNCTION PANEL
ALT: 40 IU/L — ABNORMAL HIGH (ref 0–32)
AST: 31 IU/L (ref 0–40)
Albumin: 4.1 g/dL (ref 3.8–4.9)
Alkaline Phosphatase: 96 IU/L (ref 48–121)
Bilirubin Total: 0.3 mg/dL (ref 0.0–1.2)
Bilirubin, Direct: 0.1 mg/dL (ref 0.00–0.40)
Total Protein: 6.7 g/dL (ref 6.0–8.5)

## 2020-01-10 LAB — LIPID PANEL
Chol/HDL Ratio: 3.2 ratio (ref 0.0–4.4)
Cholesterol, Total: 171 mg/dL (ref 100–199)
HDL: 54 mg/dL (ref >39)
LDL Chol Calc (NIH): 99 mg/dL (ref 0–99)
Triglycerides: 100 mg/dL (ref 0–149)
VLDL Cholesterol Cal: 18 mg/dL (ref 5–40)

## 2020-01-13 ENCOUNTER — Encounter: Payer: Self-pay | Admitting: Cardiovascular Disease

## 2020-01-13 NOTE — Progress Notes (Signed)
Cardiology Office Note:    Date:  01/14/2020   ID:  Ann Richardson, DOB [birth year], MRN [id number]  PCP:  Roger Kill, PA-C  Cardiologist:  Nghia Mcentee  Electrophysiologist:  None   Referring MD: Roger Kill, *   Chief Complaint  Patient presents with  . Shortness of Breath    History of Present Illness:    October 17, 2019:  Ann Richardson is a 59 y.o. female with a hx of smoking, hyperlipidemia ,  COPD and increasing dyspnea.   We were asked to see her for further evaluation of her dyspnea by Rueben Bash, PA   Echocardiogram performed October 04, 2019 reveals normal left ventricular systolic function.  There is mildly elevated pulmonary pressures  Has had DOE since January 2021.  Has seen pulmonary .   Saw Dr. Sherene Sires - thought it might be GERD. Was given steroids, protonix, pepcid.  Her dyspnea has not improved.  Echo showed normal LV function.  She has mildly elevated pulmonary pressures Has done a sleep study .  Has known COPD , stage III.  Quit smoking in Oct. 2020.  Smoked for 40+ years    Pulmonary function test from May, 2019 revealed an FEV1 of 1.3. The FEF 25-75 is 22% predicted. The final interpretation was severe obstructive lung disease and severe reduction in diffusion capacity.  No cp .   No regular exercise   Labs drawn at [facility] at her primary medical doctor's office were reviewed.  Her triglyceride level is 164.  The total cholesterol is 211.  The HDL is 56.  The LDL is 122.  Has family of cardiac disease.     January 14, 2020: Ann Richardson is seen today for follow-up of her shortness of breath.  She has severe COPD.  She has years of cigarette smoking.  She has a history of hyperlipidemia which she was started on rosuvastatin 10 mg a day at her last office visit.  Echocardiogram in April, 2021 reveals normal left ventricular systolic function.  She has normal diastolic function.  There is mildly elevated pulmonary  pressures with an estimated PA pressure of 33 mmHg.  Coronary CT angiogram reveals coronary calcium score of 427 which is in the 98 percentile for age and gender matched controls.  Right coronary artery has moderate to severe diffuse plaque between 50 and 69%. Left main has mild coronary artery disease. Left anterior descending artery has moderate plaque between 50 and 70%. Left circumflex artery reveals mild to moderate disease. FFR evaluation reveals no significant obstruction.  Medical therapy is indicated. We discussed the results in great detail .   Labs drawn several days ago reveals a total cholesterol of 171.  The HDL is 54.  The triglyceride levels 100.  LDL is 99.  We discussed her goal LDL of 70 She is careful with her diet,      Past Medical History:  Diagnosis Date  . Arthritis    spine- degenerative   . COPD (chronic obstructive pulmonary disease) (HCC)    CXR- 05/2016  . Hyperlipidemia   . 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 Pre-diabetes    pt. reports last A1C - 5/5, had been higher at one time     Past Surgical History:  Procedure Laterality Date  . CESAREAN SECTION  1983 and 1985  . DILATION AND CURETTAGE OF UTERUS     D&C  . LUMBAR LAMINECTOMY/DECOMPRESSION MICRODISCECTOMY N/A 06/15/2016  Procedure: L4-5, L5-S1 Decompression 2 LEVELS, InSitu FUSION L4-L5;  Surgeon: Venita Lickahari Brooks, MD;  Location: [facility] OR;  Service: Orthopedics;  Laterality: N/A;  . TONSILLECTOMY    . WISDOM TOOTH EXTRACTION     done in the hosp., done for impaction     Current Medications: Current Meds  Medication Sig  . albuterol (PROAIR HFA) 108 (90 Base) MCG/ACT inhaler Inhale 2 puffs into the lungs every 6 (six) hours as needed for wheezing or shortness of breath.  . ALPRAZolam (XANAX) 1 MG tablet Take 1 mg by mouth at bedtime as needed.   Marland Kitchen. ascorbic acid (VITAMIN C) 1000 MG tablet Vitamin C 1,000 mg tablet  Take 3 tablets every day by oral route.  .  Budeson-Glycopyrrol-Formoterol (BREZTRI AEROSPHERE) 160-9-4.8 MCG/ACT AERO Inhale 2 puffs into the lungs 2 (two) times daily.  . famotidine (PEPCID) 20 MG tablet One after supper  . morphine (MSIR) 15 MG tablet Take by mouth.  . ondansetron (ZOFRAN) 4 mg TABS tablet   . pantoprazole (PROTONIX) 40 MG tablet TAKE 1 TABLET BY MOUTH ONCE DAILY TAKE  30-60  MINUTES  BEFORE  FIRST  MEAL  OF  THE  DAY  . rosuvastatin (CRESTOR) 10 MG tablet Take 1 tablet (10 mg total) by mouth daily.  . Vitamin D, Ergocalciferol, (DRISDOL) 1.25 MG (50000 UNIT) CAPS capsule Take 50,000 Units by mouth once a week.     Allergies:   Other   Social History   Socioeconomic History  . Marital status: Single    Spouse name: Not on file  . Number of children: Not on file  . Years of education: Not on file  . Highest education level: Not on file  Occupational History  . Occupation: Biomedical engineerMortgage processing specialist  Tobacco Use  . Smoking status: Former Smoker    Packs/day: 1.00    Years: 35.00    Pack years: 35.00    Quit date: 04/13/2019    Years since quitting: 0.7  . Smokeless tobacco: Never Used  Substance and Sexual Activity  . Alcohol use: Yes    Alcohol/week: 0.0 standard drinks    Comment: social- one per month   . Drug use: No  . Sexual activity: Not on file  Other Topics Concern  . Not on file  Social History Narrative  . Not on file   Social Determinants of Health   Financial Resource Strain:   . Difficulty of Paying Living Expenses:   Food Insecurity:   . Worried About Programme researcher, broadcasting/film/videounning Out of Food in the Last Year:   . Baristaan Out of Food in the Last Year:   Transportation Needs:   . Freight forwarderLack of Transportation (Medical):   Marland Kitchen. Lack of Transportation (Non-Medical):   Physical Activity:   . Days of Exercise per Week:   . Minutes of Exercise per Session:   Stress:   . Feeling of Stress :   Social Connections:   . Frequency of Communication with Friends and Family:   . Frequency of Social Gatherings with  Friends and Family:   . Attends Religious Services:   . Active Member of Clubs or Organizations:   . Attends BankerClub or Organization Meetings:   Marland Kitchen. Marital Status:      Family History: The patient's family history includes Breast cancer in her mother; Emphysema in her mother; Heart disease in her father and mother.  ROS:   Please see the history of present illness.     All other systems reviewed and are negative.  EKGs/Labs/Other Studies Reviewed:    The following studies were reviewed today:   EKG:    Recent Labs: 09/17/2019: Pro B Natriuretic peptide (BNP) 16.0 12/16/2019: Hemoglobin 13.0; Platelets 246.0; TSH 0.62 01/10/2020: ALT 40; BUN 11; Creatinine, Ser 0.65; Potassium 3.9; Sodium 141  Recent Lipid Panel    Component Value Date/Time   CHOL 171 01/10/2020 0735   TRIG 100 01/10/2020 0735   HDL 54 01/10/2020 0735   CHOLHDL 3.2 01/10/2020 0735   LDLCALC 99 01/10/2020 0735    Physical Exam:    Physical Exam: Blood pressure 122/66, pulse 86, height 5\' 1"  (1.549 m), weight 158 lb 12.8 oz (72 kg), SpO2 96 %.  GEN:  Well nourished, well developed in no acute distress HEENT: Normal NECK: No JVD; No carotid bruits LYMPHATICS: No lymphadenopathy CARDIAC: RRR , no murmurs, rubs, gallops RESPIRATORY:  Clear to auscultation without rales, wheezing or rhonchi  ABDOMEN: Soft, non-tender, non-distended MUSCULOSKELETAL:  No edema; No deformity  SKIN: Warm and dry NEUROLOGIC:  Alert and oriented x 3      ASSESSMENT:    1. DOE (dyspnea on exertion)   2. Hyperlipidemia, unspecified hyperlipidemia type   3. Medication management    PLAN:      1.  Shortness of breath:  Likely due to severe COPD. / pulmonary HTN.     She has normal LV  function .   She has moderate CAD but no severe obstructions to explain her dyspnea  .  2.  Hyperlipidemia:  Her goal LDL  is 70.  She is currently at 50 Will add Zetia 10 mg a day .  Recheck lipids, liver , bmp in 3 months .  Will see her  in 1 year   3.  elevated glucose levels :   She will discuss with her primary MD     Medication Adjustments/Labs and Tests Ordered: Current medicines are reviewed at length with the patient today.  Concerns regarding medicines are outlined above.  Orders Placed This Encounter  Procedures  . Basic Metabolic Panel (BMET)  . Lipid Profile  . Hepatic function panel   Meds ordered this encounter  Medications  . ezetimibe (ZETIA) 10 MG tablet    Sig: Take 1 tablet (10 mg total) by mouth daily.    Dispense:  90 tablet    Refill:  3    Patient Instructions  Medication Instructions:  Your physician has recommended you make the following change in your medication: Add Zetia 10 mg a day   *If you need a refill on your cardiac medications before your next appointment, please call your pharmacy*   Lab Work: Lipid, liver, bmet in 3 months  If you have labs (blood work) drawn today and your tests are completely normal, you will receive your results only by: 83 MyChart Message (if you have MyChart) OR . A paper copy in the mail If you have any lab test that is abnormal or we need to change your treatment, we will call you to review the results.   Testing/Procedures: none   Follow-Up: At [facility], you and your health needs are our priority.  As part of our continuing mission to provide you with exceptional heart care, we have created designated Provider Care Teams.  These Care Teams include your primary Cardiologist (physician) and Advanced Practice Providers (APPs -  Physician Assistants and Nurse Practitioners) who all work together to provide you with the care you need, when you need it.  We recommend signing  up for the patient portal called "MyChart".  Sign up information is provided on this After Visit Summary.  MyChart is used to connect with patients for Virtual Visits (Telemedicine).  Patients are able to view lab/test results, encounter notes, upcoming appointments, etc.   Non-urgent messages can be sent to your provider as well.   To learn more about what you can do with MyChart, go to [email or web address].    Your next appointment:   1 year(s)  The format for your next appointment:   In Person  Provider:   Kristeen Miss, MD   Other Instructions      Signed, Kristeen Miss, MD  01/14/2020 8:27 AM    [facility]

## 2020-01-14 ENCOUNTER — Other Ambulatory Visit: Payer: Self-pay

## 2020-01-14 ENCOUNTER — Encounter: Payer: Self-pay | Admitting: Cardiovascular Disease

## 2020-01-14 ENCOUNTER — Ambulatory Visit (INDEPENDENT_AMBULATORY_CARE_PROVIDER_SITE_OTHER): Payer: 59 | Admitting: Cardiovascular Disease

## 2020-01-14 VITALS — BP 122/66 | HR 86 | Ht 61.0 in | Wt 158.8 lb

## 2020-01-14 DIAGNOSIS — E785 Hyperlipidemia, unspecified: Secondary | ICD-10-CM | POA: Diagnosis not present

## 2020-01-14 DIAGNOSIS — Z79899 Other long term (current) drug therapy: Secondary | ICD-10-CM

## 2020-01-14 DIAGNOSIS — R0600 Dyspnea, unspecified: Secondary | ICD-10-CM | POA: Diagnosis not present

## 2020-01-14 DIAGNOSIS — I272 Pulmonary hypertension, unspecified: Secondary | ICD-10-CM | POA: Diagnosis not present

## 2020-01-14 DIAGNOSIS — R0609 Other forms of dyspnea: Secondary | ICD-10-CM

## 2020-01-14 MED ORDER — EZETIMIBE 10 MG PO TABS: 10.0000 mg | ORAL_TABLET | Freq: Every day | ORAL | 3 refills | Status: DC

## 2020-01-14 NOTE — Patient Instructions (Signed)
Medication Instructions:  Your physician has recommended you make the following change in your medication: Add Zetia 10 mg a day   *If you need a refill on your cardiac medications before your next appointment, please call your pharmacy*   Lab Work: Lipid, liver, bmet in 3 months  If you have labs (blood work) drawn today and your tests are completely normal, you will receive your results only by: Marland Kitchen MyChart Message (if you have MyChart) OR . A paper copy in the mail If you have any lab test that is abnormal or we need to change your treatment, we will call you to review the results.   Testing/Procedures: none   Follow-Up: At [facility], you and your health needs are our priority.  As part of our continuing mission to provide you with exceptional heart care, we have created designated Provider Care Teams.  These Care Teams include your primary Cardiologist (physician) and Advanced Practice Providers (APPs -  Physician Assistants and Nurse Practitioners) who all work together to provide you with the care you need, when you need it.  We recommend signing up for the patient portal called "MyChart".  Sign up information is provided on this After Visit Summary.  MyChart is used to connect with patients for Virtual Visits (Telemedicine).  Patients are able to view lab/test results, encounter notes, upcoming appointments, etc.  Non-urgent messages can be sent to your provider as well.   To learn more about what you can do with MyChart, go to [email or web address].    Your next appointment:   1 year(s)  The format for your next appointment:   In Person  Provider:   Kristeen Miss, MD   Other Instructions

## 2020-01-17 ENCOUNTER — Ambulatory Visit (INDEPENDENT_AMBULATORY_CARE_PROVIDER_SITE_OTHER): Payer: 59 | Admitting: Internal Medicine

## 2020-01-17 ENCOUNTER — Encounter: Payer: Self-pay | Admitting: Internal Medicine

## 2020-01-17 ENCOUNTER — Other Ambulatory Visit: Payer: Self-pay

## 2020-01-17 VITALS — BP 128/82 | HR 86 | Ht 61.0 in | Wt 159.0 lb

## 2020-01-17 DIAGNOSIS — I272 Pulmonary hypertension, unspecified: Secondary | ICD-10-CM | POA: Diagnosis not present

## 2020-01-17 DIAGNOSIS — J449 Chronic obstructive pulmonary disease, unspecified: Secondary | ICD-10-CM | POA: Diagnosis not present

## 2020-01-17 DIAGNOSIS — G479 Sleep disorder, unspecified: Secondary | ICD-10-CM

## 2020-01-17 DIAGNOSIS — G4734 Idiopathic sleep related nonobstructive alveolar hypoventilation: Secondary | ICD-10-CM | POA: Diagnosis not present

## 2020-01-17 NOTE — Progress Notes (Signed)
HPI F former smoker referred for sleep evaluation with difficulty initiating and maintaining sleep. Medical problem list includes COPD GOLD III, Nocturnal Hypoxemia, Otitis media R, Upper Airway Cough Syndrome. HST 10/01/19- AHI 2.9/ hr, desaturation to 82% with 171 minutes </= 89%, body weight 154 lbs PFT 11/29/19- severe COPD ----------------------------------------------------------------  09/23/19- 59 yo F former smoker referred for sleep evaluation with difficulty initiating and maintaining sleep. Medical problem list includes COPD GOLD III, Otitis media R, Upper Airway Cough Syndrome. Pulmonary problems followed by Dr Sherene Sires. Med list includes Xanax 1 mg at hs, albuterol hfa, Breztri,  Epworth score 6 Difficulty initiating and maintaining sleep. Doesn't think it is stimulation from bronchodilators.  Recently told by domestic partner she is snoring more. Weight gain 25 lbs " 2 months". HS 8-10PM, up between 2AM and 7AM. Tried melatonin, ambien, trazodone. Xanax worked initially.  1-2 cups AM coffee.  ENT+ tonsillectomy. Pending echocardiogram. No hx thyroid prob.  Some leg cramps, but not classic restless legs or complex parasomnias.   01/17/20- 71 yo F former smoker referred for sleep evaluation with difficulty initiating and maintaining sleep. Medical problem list includes COPD GOLD III, Nocturnal Hypoxemia, Otitis media R, Upper Airway Cough Syndrome. Pulmonary problems followed by Dr Sherene Sires. -----3 month f/u for COPD and OSA. States she had ONOX back in May but never heard the results. Still on 2L of O2 at night. Not on any CPAP or BiPap.  HST 10/01/19- AHI 2.9/ hr, desaturation to 82% with 171 minutes </= 89%, body weight 154 lbs PFT 11/29/19- severe COPD O2 2l sleep/ [facility] Pending f/u for Pulmonary problems with Dr Sherene Sires in September. Now using O2 2L for sleep. Asks about skipping it for travel- discussed availability of POC. She feels more drowsy during the day since using O2 at night but  denies AM headache.  Using less Xanax. Had 2 Moderna Covaxx   ROS-see HPI   + = positive Constitutional:    weight loss, night sweats, fevers, chills, fatigue, lassitude. HEENT:    headaches, difficulty swallowing, tooth/dental problems, sore throat,       sneezing, itching, ear ache, nasal congestion, post nasal drip, snoring CV:    chest pain, orthopnea, PND, swelling in lower extremities, anasarca,                                   dizziness, palpitations Resp:   +shortness of breath with exertion or at rest.                productive cough,   non-productive cough, coughing up of blood.              change in color of mucus.  wheezing.   Skin:    rash or lesions. GI:  No-   heartburn, indigestion, abdominal pain, nausea, vomiting, diarrhea,                 change in bowel habits, loss of appetite GU: dysuria, change in color of urine, no urgency or frequency.   flank pain. MS:   joint pain, stiffness, decreased range of motion, back pain. Neuro-     nothing unusual Psych:  change in mood or affect.  depression or anxiety.   memory loss.  OBJ- Physical Exam General- Alert, Oriented, Affect-appropriate, Distress- none acute, + overweight Skin- rash-none, lesions- none, excoriation- none Lymphadenopathy- none Head- atraumatic  Eyes- Gross vision intact, PERRLA, conjunctivae and secretions clear            Ears- Hearing, canals-normal            Nose- Clear, no-Septal dev, mucus, polyps, erosion, perforation             Throat- Mallampati II , mucosa clear , drainage- none, tonsils- atrophic Neck- flexible , trachea midline, no stridor , thyroid nl, carotid no bruit Chest - symmetrical excursion , unlabored           Heart/CV- RRR , no murmur , no gallop  , no rub, nl s1 s2                           - JVD- none , edema- none, stasis changes- none, varices- none           Lung- clear to P&A, wheeze- none, cough- none , dullness-none, rub- none           Chest wall-  Abd-   Br/ Gen/ Rectal- Not done, not indicated Extrem- cyanosis- none, clubbing, none, atrophy- none, strength- nl Neuro- grossly intact to observation

## 2020-01-17 NOTE — Patient Instructions (Signed)
Order- ABG on room air    Nocturnal Hypoxemia  Follow up with Dr Sherene Sires for  General pulmonary management

## 2020-01-19 NOTE — Assessment & Plan Note (Signed)
Hopefully sleep O2 will allow lower PA pressures.

## 2020-01-19 NOTE — Assessment & Plan Note (Signed)
Primary focus is on pulmonary status. She will f/u w Dr Sherene Sires as scheduled. I can see her again for sleep if needed.

## 2020-01-19 NOTE — Assessment & Plan Note (Signed)
HST showed no OSA, but sustained nocturnal hypoxemia. Hoped adding O2 during sleep would help sleep with fewer arousals, but she says she is feeling tireder in the day.  HCO3 is not elevated and she denies morning HA, but will get ABG on room air, looking for CO2 retention.

## 2020-01-30 ENCOUNTER — Other Ambulatory Visit: Payer: Self-pay

## 2020-01-30 ENCOUNTER — Ambulatory Visit (HOSPITAL_COMMUNITY)
Admission: RE | Admit: 2020-01-30 | Discharge: 2020-01-30 | Disposition: A | Discharge status: Home / Self Care | Payer: 59 | Source: Ambulatory Visit | Attending: Internal Medicine | Admitting: Internal Medicine

## 2020-01-30 DIAGNOSIS — G4734 Idiopathic sleep related nonobstructive alveolar hypoventilation: Secondary | ICD-10-CM

## 2020-01-30 LAB — BLOOD GAS, ARTERIAL
Acid-Base Excess: 2 mmol/L (ref 0.0–2.0)
Bicarbonate: 26.1 mmol/L (ref 20.0–28.0)
Drawn by: 21179
FIO2: 21
O2 Saturation: 96.5 %
Patient temperature: 37
pCO2 arterial: 41.2 mmHg (ref 32.0–48.0)
pH, Arterial: 7.419 (ref 7.350–7.450)
pO2, Arterial: 82.7 mmHg — ABNORMAL LOW (ref 83.0–108.0)

## 2020-03-12 ENCOUNTER — Other Ambulatory Visit: Payer: Self-pay

## 2020-03-12 MED ORDER — ROSUVASTATIN CALCIUM 10 MG PO TABS: 10.0000 mg | ORAL_TABLET | Freq: Every day | ORAL | 3 refills | Status: AC

## 2020-03-19 ENCOUNTER — Other Ambulatory Visit: Payer: Self-pay

## 2020-03-19 MED ORDER — EZETIMIBE 10 MG PO TABS: 10.0000 mg | ORAL_TABLET | Freq: Every day | ORAL | 3 refills | Status: AC

## 2020-03-19 NOTE — Addendum Note (Signed)
Addended by: Orlene Och on: 03/19/2020 05:26 PM   Modules accepted: Orders

## 2020-03-21 ENCOUNTER — Other Ambulatory Visit: Payer: Self-pay | Admitting: Internal Medicine

## 2020-03-21 MED ORDER — BREZTRI AEROSPHERE 160-9-4.8 MCG/ACT IN AERO: 2.0000 | INHALATION_SPRAY | Freq: Two times a day (BID) | RESPIRATORY_TRACT | 3 refills | Status: AC

## 2020-03-21 MED ORDER — PANTOPRAZOLE SODIUM 40 MG PO TBEC: DELAYED_RELEASE_TABLET | ORAL | 3 refills | Status: AC

## 2020-03-23 ENCOUNTER — Other Ambulatory Visit: Payer: Self-pay

## 2020-03-23 ENCOUNTER — Encounter: Payer: Self-pay | Admitting: Internal Medicine

## 2020-03-23 ENCOUNTER — Ambulatory Visit (INDEPENDENT_AMBULATORY_CARE_PROVIDER_SITE_OTHER): Payer: 59 | Admitting: Internal Medicine

## 2020-03-23 VITALS — BP 128/74 | HR 101 | Temp 98.0°F | Ht 60.0 in | Wt 160.0 lb

## 2020-03-23 DIAGNOSIS — R058 Other specified cough: Secondary | ICD-10-CM

## 2020-03-23 DIAGNOSIS — J9611 Chronic respiratory failure with hypoxia: Secondary | ICD-10-CM

## 2020-03-23 DIAGNOSIS — J449 Chronic obstructive pulmonary disease, unspecified: Secondary | ICD-10-CM | POA: Diagnosis not present

## 2020-03-23 DIAGNOSIS — Z23 Encounter for immunization: Secondary | ICD-10-CM | POA: Diagnosis not present

## 2020-03-23 DIAGNOSIS — R05 Cough: Secondary | ICD-10-CM | POA: Diagnosis not present

## 2020-03-23 NOTE — Assessment & Plan Note (Signed)
-   sinus CT  04/02/2018 Mild mucosal edema base of left maxillary sinus. Otherwise sinuses are clear - 03/23/2020 improved on 1st gen H1 blockers per guidelines    Encouraged use of hard rock candy avoid mint/ menthol chocolate

## 2020-03-23 NOTE — Assessment & Plan Note (Addendum)
Wears 2lpm hs as of 12/16/19 -  03/23/2020   Walked RA  3 laps @ approx 233ft each @ avg pace  stopped due sob with desats to 88% in third lap but declined amb 02/ advised pacing   She is concerned about wt of any portable system but motivated to exercise more to get wt down > best option would be a home treadmill which she could place next to her concentrator or ex at gym with e cylinder as she is unlikely to desat with adls  For now choses to pace a bit slower and keep sats > 90% as the goal and call back if this not adequate for her desired level of exertion          Each maintenance medication was reviewed in detail including emphasizing most importantly the difference between maintenance and prns and under what circumstances the prns are to be triggered using an action plan format where appropriate.  Total time for H and P, chart review, counseling, teaching device  directly observing portions of ambulatory 02 saturation study/  and generating customized AVS unique to this office visit / charting = 37 min

## 2020-03-23 NOTE — Assessment & Plan Note (Signed)
Quit smoking 03/2019    - 01/08/2015 pfts  FEV1  1.31 (54%) s am meds and ratio 56% p 9% improvement form saba with dlco 57 to 73% p correct for alv vol > rec symb 160 2bid  - alpha one  01/08/2015 > MM - PFT's  10/30/2017  FEV1 1.00 (43 % ) ratio 51  p 0 % improvement from saba p symbicort 160 x 2  prior to study with DLCO  39 % corrects to 52 % for alv volume  - 10/30/2017  After extensive coaching inhaler device  effectiveness =    75% > try bevespi 2bid   02/27/2018- Added Qvar 40 03/29/18  changed to symb 160/spiriva  And added flutter valve 03/2019 - quit smoking  08/30/2019  After extensive coaching inhaler device,  effectiveness =    75% try breztri 2 bid 09/17/2019 - referred to pulmonary rehab PFT's  11/29/19  FEV1 1.24 (54 % ) ratio 0.57  p 9 % improvement from saba p Breztri prior to study with DLCO  10.83 (59%) corrects to 2.68 (62%)  for alv volume and FV curve classic moderate concavity   - 03/23/2020  After extensive coaching inhaler device,  effectiveness =    75% (I time too short)  - 03/23/2020 desats walking nl pace 03/23/2020 see chronic resp failure > declined amb 02 due to concerns with back pain and doesn't want to have to carry anything when exerting so rec walk slower    Group D in terms of symptom/risk and laba/lama/ICS  therefore appropriate rx at this point >>>  Continue breztri 2 bid / prn saba

## 2020-03-23 NOTE — Patient Instructions (Signed)
Make sure you check your oxygen saturations at highest level of activity to be sure it stays over 90% and if not walk at slower pace or consider returning here for trial of a portable device   Work on inhaler technique:  relax and gently blow all the way out then take a nice smooth deep breath back in, triggering the inhaler at same time you start breathing in.  Hold for up to 5 seconds if you can. Blow breztri out thru nose. Rinse and gargle with water when done     Please schedule a follow up visit in 3 months but call sooner if needed

## 2020-03-23 NOTE — Progress Notes (Addendum)
Subjective:    Patient ID: [name], female   DOB: [birth year]   MRN: [id number]    Brief patient profile:  59  yowf  Bostonian   MM/ quit smoking 03/2019   never intense aerobic but able to do sports/ soft ball and did fine until around 2010 with doe with yardwork with assoc cough in addition has in frequency frequency episodes of bronchitis rx abx/ prednisone better then tried advair/ singulair no better so referred pulmonary clinic 11/26/2014 by  Cooper RenderElizabeth Cobb NP with dx GOLD II copd/  With GOLD III criteria 10/30/2017 .   History of Present Illness  11/26/2014 1st [facility] office visit/ Ann Richardson   Chief Complaint  Patient presents with  . Pulmonary Consult    Referred by Cooper RenderElizabeth Cobb, NP. Pt states she has been dxed with Bronchitis x 3 so far in 2016. She c/o cough and SOB. She gets SOB with walking up stairs. Her cough is non prod at this time. She is using albuterol inhaler 2-3 x per wk on average.   no real change symptoms on advair but hfa poor  Cough quite a bit worse in am but not productive p last round of prednisone and her goal is to prevent future flares  rec Stop advair and singulair  Plan A = automatic = symbicort 80 Take 2 puffs first thing in am and then another 2 puffs about 12 hours later.  Plan B = backup - Only use your albuterol as a rescue medication   01/08/2015 f/u ov/Ann Richardson re: copd  GOLD II/ confused with meds/ names maint vs prns  - thinks she's still on advair  Chief Complaint  Patient presents with  . office visit    pft done, inhalers are helping, breathing not bad asit was going up stairs & 1st thing in the AM   poor mdi but doing better / cough some better but still some  Min white mucus production  rec You have GOLD II severity-  moderate and will continue to progress as long as you smoke - the key is to stop smoking now  Continue symbicort  160  Take 2 puffs first thing in am and then another 2 puffs about 12 hours later.  Work on inhaler  technique:       10/30/2017  f/u ov/Ann Richardson re:  GOLD III copd now on symb 160 2bid Chief Complaint  Patient presents with  . Follow-up    Feeling better ,PFT today.  Dyspnea:  Limited by L leg in splint Cough: some first thing in am > mucoid Sleep: 1 pillows  SABA use: none since started on acid suppression    rec Pantoprazole (protonix) 40 mg   Take  30-60 min before first meal of the day and Pepcid (famotidine)  20 mg one @  bedtime until return to office - this is the best way to tell whether stomach acid is contributing to your problem.   Change symbicort to  Bevespi Take 2 puffs first thing in am and then another 2 puffs about 12 hours later.  Work on inhaler technique:  The key is to stop smoking completely before smoking completely stops you!     03/29/2018  f/u ov/Ann Richardson re: copd GOLD II/ still smoking  Chief Complaint  Patient presents with  . Follow-up    went to UC after last visit and was dxed with bronchitis. She states still having SOB and prod cough with clear sputum.  She is using her  ventolin 2 x daily on average.   75% better while on aug/pred then gradually worse  > eval by NP  02/27/18  rec Continue Bevespi- 2 puffs twice a day Add inhaled corticosteroid - Qvar 1 puff twice a day  Back up- Albuterol rescue inhaler  Start zyrtec daily as needed  Sending in RX dymista nasal spray   Some better while on dymista  and substituted flonase/atelin and some better then a lot worse 03/10/18 and seen in UC 03/17/18 with zpak / pred > seemed some better then worse off it  rec GERDdiet Stop bevespi and qvar  Plan A = Automatic = symbicort 160 Take 2 puffs first thing in am and then another 2 puffs about 12 hours later and spiriva 2 pffs each am  Plan B = Backup Only use your albuterol as a rescue medication  Plan C = Crisis - only use your albuterol nebulizer if you first try Plan B and it fails to help  For cough/ congestion > mucinex dm  1200 mg every 12 hours and use the  flutter valve as much as you can  Prednisone 10 mg Take 4 for three days 3 for three days 2 for three days 1 for three days and stop  chedule sinus CT > min edema L max Please schedule a follow up office visit in 6 weeks, call sooner if needed with all medications /inhalers/ solutions in hand    02/11/2019  f/u ov/Ann Richardson re:  GOLD II maint symb/ spriva/ still smoking but down to 4-5 per day on chantix  Chief Complaint  Patient presents with  . Follow-up    Patient reports that her breathing is doing well. She reports using her rescue inhaler with extertion. She also reports a cough when she gets too hot.  Dyspnea:  MMRC1 = can walk nl pace, flat grade, can't hurry or go uphills or steps s sob / worse with mask Works from home behind computer  Cough: some first thing in am x 15 min and productive white min vol Sleeping: no resp symptoms / flat bed / 2 pilows  SABA use: very rarely x with mask in grocery store seems to helps  02: none    08/30/2019  f/u ov/Ann Richardson re: GOLD II / no longer smoking / maint on symb/spiriva Chief Complaint  Patient presents with  . Follow-up    COPD GOLD II / Quit smoking April 15, 2019  Dyspnea:  Was doing walk around complex x 20 min consistently and then gradually lost ground since quit smoking, esp in last month - has been gaining wt and now doesn't leave house = MMRC4  = sob if tries to leave home or while getting dressed   Cough: less since quit with urge to clear throat but no mucus  Sleeping: able to lie flat / 2-3 pillows  SABA use: never prechallenges and never helps recover quicker  02: none  rec Plan A = Automatic = Always=    Breztri  Take 2 puffs first thing in am and then another 2 puffs about 12 hours later.  Plan B = Backup (to supplement plan A, not to replace it) Only use your albuterol inhaler as a rescue medication Prednisone 10 mg take  4 each am x 2 days,   2 each am x 2 days,  1 each am x 2 days and stop  Pantoprazole (protonix) 40 mg    Take  30-60 min before first meal of the day  and Pepcid (famotidine)  20 mg one @  bedtime until return to office - this is the best way to tell whether stomach acid is contributing to your problem.   Try albuterol 15 min before an activity that you know would make you short of breath GERD.diet      12/16/2019  f/u ov/Ann Richardson re:  Still gold II copd maint on breztri / 02 hs only  Chief Complaint  Patient presents with  . Follow-up    SOB is about the same. Can't walk for more than 5 mins.   Dyspnea:  gxt 5 x weekly at least 20 min at 2.2 mph and 2 degrees with sats ok  Cough: still daytime urge to clear throat  No mucus, some better on gerd rx  Sleeping: able to lie flat 2-3 pillows  SABA use: rarely  - doesn't help  02: 2lpm hs  rec Over the counter zyrtec 10 mg daily as needed for cough and if not effective > For drainage / throat tickle try take CHLORPHENIRAMINE  4 mg    Weight control is simply a matter of calorie balance  Make sure you check your oxygen saturations at highest level of activity to be sure it stays over 90%   Work on inhaler technique:  Continue 02 at bedtime 2lpm  Please schedule a follow up visit in 3 months but call sooner if needed  with all medications /inhalers/ solutions in hand   03/23/2020  f/u ov/Ann Richardson re: GOLD  II copd/ maint on breztri / 02 just bedime/ not as active x one month  Chief Complaint  Patient presents with  . Follow-up  Dyspnea:  gxt until a month prior to OV  Was making slow progress  Cough: better / still some urge to clear throat  Sleeping: just 02 now s am ha 2lpm / 2 pillows  SABA use: rarely  02: just hs does not have portable 02   No obvious day to day or daytime variability or assoc excess/ purulent sputum or mucus plugs or hemoptysis or cp or chest tightness, subjective wheeze or overt sinus or hb symptoms.   Sleeping  without nocturnal  or early am exacerbation  of respiratory  c/o's or need for noct saba. Also denies any obvious  fluctuation of symptoms with weather or environmental changes or other aggravating or alleviating factors except as outlined above   No unusual exposure hx or h/o childhood pna/ asthma or knowledge of premature birth.  Current Allergies, Complete Past Medical History, Past Surgical History, Family History, and Social History were reviewed in [employer] [facility] record.  ROS  The following are not active complaints unless bolded Hoarseness, sore throat, dysphagia, dental problems, itching, sneezing,  nasal congestion or discharge of excess mucus or purulent secretions, ear ache,   fever, chills, sweats, unintended wt loss or wt gain, classically pleuritic or exertional cp,  orthopnea pnd or arm/hand swelling  or leg swelling, presyncope, palpitations, abdominal pain, anorexia, nausea, vomiting, diarrhea  or change in bowel habits or change in bladder habits, change in stools or change in urine, dysuria, hematuria,  rash, arthralgias, visual complaints, headache, numbness, weakness or ataxia or problems with walking or coordination,  change in mood or  memory.        Current Meds  Medication Sig  . albuterol (PROAIR HFA) 108 (90 Base) MCG/ACT inhaler Inhale 2 puffs into the lungs every 6 (six) hours as needed for wheezing or shortness of breath.  Marland Kitchen  ALPRAZolam (XANAX) 1 MG tablet Take 1 mg by mouth at bedtime as needed.   Marland Kitchen ascorbic acid (VITAMIN C) 1000 MG tablet Vitamin C 1,000 mg tablet  Take 3 tablets every day by oral route.  . Budeson-Glycopyrrol-Formoterol (BREZTRI AEROSPHERE) 160-9-4.8 MCG/ACT AERO Inhale 2 puffs into the lungs 2 (two) times daily. Script for 90 day supply  . ezetimibe (ZETIA) 10 MG tablet Take 1 tablet (10 mg total) by mouth daily.  . famotidine (PEPCID) 20 MG tablet One after supper  . metformin (FORTAMET) 500 MG (OSM) 24 hr tablet Take 500 mg by mouth daily with breakfast.  . morphine (MSIR) 15 MG tablet Take by mouth.  . ondansetron (ZOFRAN) 4 mg TABS  tablet   . pantoprazole (PROTONIX) 40 MG tablet TAKE 1 TABLET BY MOUTH ONCE DAILY TAKE  30-60  MINUTES  BEFORE  FIRST  MEAL  OF  THE  DAY  . rosuvastatin (CRESTOR) 10 MG tablet Take 1 tablet (10 mg total) by mouth daily.              Objective:   Physical Exam    03/23/2020        160  12/16/2019        156   08/30/2019         151  02/11/2019        151  08/10/2018        131  05/10/2018      125 03/29/2018        122  02/06/2018       123 10/30/2017         127  09/22/2017       127   06/06/2016    130  04/10/15 141 lb 6.4 oz (64.139 kg)  01/08/15 140 lb (63.504 kg)  11/26/14 137 lb (62.143 kg)     Vital signs reviewed  03/23/2020  - Note at rest 02 sats  94% on RA      amb wf occ dry throat clearing   HEENT : pt wearing mask not removed for exam due to covid - 19 concerns.    NECK :  without JVD/Nodes/TM/ nl carotid upstrokes bilaterally   LUNGS: no acc muscle use,  Mild barrel  contour chest wall with bilateral  Distant bs s audible wheeze and  without cough on insp or exp maneuvers  and mild  Hyperresonant  to  percussion bilaterally     CV:  RRR  no s3 or murmur or increase in P2, and no edema   ABD: obese soft and nontender with pos end  insp Hoover's  in the supine position. No bruits or organomegaly appreciated, bowel sounds nl  MS:   Nl gait/  ext warm without deformities, calf tenderness, cyanosis or clubbing No obvious joint restrictions   SKIN: warm and dry without lesions    NEURO:  alert, approp, nl sensorium with  no motor or cerebellar deficits apparent.         I personally reviewed images and agree with radiology impression as follows:   Chest CT cuts on CT coronary  11/15/19 Moderate centrilobular emphysema.      Assessment:

## 2020-04-15 ENCOUNTER — Other Ambulatory Visit: Payer: Self-pay

## 2020-04-15 ENCOUNTER — Other Ambulatory Visit: Payer: 59 | Admitting: *Deleted

## 2020-04-15 DIAGNOSIS — R0609 Other forms of dyspnea: Secondary | ICD-10-CM

## 2020-04-15 DIAGNOSIS — Z79899 Other long term (current) drug therapy: Secondary | ICD-10-CM

## 2020-04-15 DIAGNOSIS — E785 Hyperlipidemia, unspecified: Secondary | ICD-10-CM

## 2020-04-15 LAB — HEPATIC FUNCTION PANEL
ALT: 28 IU/L (ref 0–32)
AST: 19 IU/L (ref 0–40)
Albumin: 4.4 g/dL (ref 3.8–4.9)
Alkaline Phosphatase: 84 IU/L (ref 44–121)
Bilirubin Total: 0.4 mg/dL (ref 0.0–1.2)
Bilirubin, Direct: 0.16 mg/dL (ref 0.00–0.40)
Total Protein: 6.8 g/dL (ref 6.0–8.5)

## 2020-04-15 LAB — BASIC METABOLIC PANEL
BUN/Creatinine Ratio: 19 (ref 12–28)
BUN: 13 mg/dL (ref 8–27)
CO2: 27 mmol/L (ref 20–29)
Calcium: 9.5 mg/dL (ref 8.7–10.3)
Chloride: 101 mmol/L (ref 96–106)
Creatinine, Ser: 0.68 mg/dL (ref 0.57–1.00)
GFR calc Af Amer: 110 mL/min/{1.73_m2} (ref >59)
GFR calc non Af Amer: 95 mL/min/{1.73_m2} (ref >59)
Glucose: 136 mg/dL — ABNORMAL HIGH (ref 65–99)
Potassium: 4 mmol/L (ref 3.5–5.2)
Sodium: 140 mmol/L (ref 134–144)

## 2020-04-15 LAB — LIPID PANEL
Chol/HDL Ratio: 2.2 ratio (ref 0.0–4.4)
Cholesterol, Total: 90 mg/dL — ABNORMAL LOW (ref 100–199)
HDL: 41 mg/dL (ref >39)
LDL Chol Calc (NIH): 33 mg/dL (ref 0–99)
Triglycerides: 73 mg/dL (ref 0–149)
VLDL Cholesterol Cal: 16 mg/dL (ref 5–40)

## 2020-06-15 ENCOUNTER — Ambulatory Visit: Payer: 59 | Admitting: Internal Medicine

## 2020-06-29 ENCOUNTER — Ambulatory Visit: Payer: 59 | Admitting: Internal Medicine

## 2020-08-13 DIAGNOSIS — R0609 Other forms of dyspnea: Secondary | ICD-10-CM

## 2020-08-13 DIAGNOSIS — R058 Other specified cough: Secondary | ICD-10-CM

## 2020-08-13 DIAGNOSIS — J9611 Chronic respiratory failure with hypoxia: Secondary | ICD-10-CM

## 2020-08-13 DIAGNOSIS — J449 Chronic obstructive pulmonary disease, unspecified: Secondary | ICD-10-CM

## 2020-08-13 DIAGNOSIS — G4733 Obstructive sleep apnea (adult) (pediatric): Secondary | ICD-10-CM

## 2020-08-13 DIAGNOSIS — R0600 Dyspnea, unspecified: Secondary | ICD-10-CM

## 2020-08-13 NOTE — Telephone Encounter (Signed)
mychart message sent by pt stating that she has moved from [place] to [place] and is needing order to be placed for her to see new pulmonologist Dr. Imagene Gurney. Referral order has been placed. Stated to pt that she needs to contact our medical records dept of [facility] so they can take care of getting her records sent to the new pulmonologist.  Sending to Dr. Sherene Sires as an Lorain Childes.

## 2020-12-31 ENCOUNTER — Telehealth: Payer: Self-pay | Admitting: Internal Medicine

## 2020-12-31 NOTE — Telephone Encounter (Signed)
Attempted to contact however it is after 5pm. Will leave in triage and try back during business hours tomorrow.

## 2021-01-04 NOTE — Telephone Encounter (Signed)
Received fax from Clydie Braun with [employer] will fax to the [facility] office as Dr. Sherene Sires is there the next 2 weeks. Will route message to [place] to follow up.

## 2021-01-04 NOTE — Telephone Encounter (Signed)
Form faxed, confirmation received and placed in scan folder.

## 2021-01-04 NOTE — Telephone Encounter (Signed)
Spoke with Ann Richardson from [facility] who states that she tried to fax form to our office the other day but it didn't go through. Gave her the triage fax number of [phone] advised her to send it there and I would get it to Dr. Sherene Sires. Will wait for fax.
